# Patient Record
Sex: Female | Born: 1955 | Race: White | Hispanic: No | Marital: Married | Smoking: Current every day smoker
Health system: Southern US, Community
[De-identification: ages and names within clinical notes are randomized; demographics above are authoritative.]

## PROBLEM LIST (undated history)

## (undated) DIAGNOSIS — Z8719 Personal history of other diseases of the digestive system: Secondary | ICD-10-CM

## (undated) DIAGNOSIS — T4145XA Adverse effect of unspecified anesthetic, initial encounter: Secondary | ICD-10-CM

## (undated) DIAGNOSIS — F329 Major depressive disorder, single episode, unspecified: Secondary | ICD-10-CM

## (undated) DIAGNOSIS — K5790 Diverticulosis of intestine, part unspecified, without perforation or abscess without bleeding: Secondary | ICD-10-CM

## (undated) DIAGNOSIS — F319 Bipolar disorder, unspecified: Secondary | ICD-10-CM

## (undated) DIAGNOSIS — K2970 Gastritis, unspecified, without bleeding: Secondary | ICD-10-CM

## (undated) DIAGNOSIS — T8859XA Other complications of anesthesia, initial encounter: Secondary | ICD-10-CM

## (undated) DIAGNOSIS — F32A Depression, unspecified: Secondary | ICD-10-CM

## (undated) DIAGNOSIS — M8700 Idiopathic aseptic necrosis of unspecified bone: Secondary | ICD-10-CM

## (undated) DIAGNOSIS — R112 Nausea with vomiting, unspecified: Secondary | ICD-10-CM

## (undated) DIAGNOSIS — K219 Gastro-esophageal reflux disease without esophagitis: Secondary | ICD-10-CM

## (undated) DIAGNOSIS — Z9889 Other specified postprocedural states: Secondary | ICD-10-CM

## (undated) DIAGNOSIS — F419 Anxiety disorder, unspecified: Secondary | ICD-10-CM

## (undated) HISTORY — PX: ROTATOR CUFF REPAIR: SHX139

## (undated) HISTORY — PX: ORIF WRIST FRACTURE: SHX2133

## (undated) HISTORY — PX: HERNIA REPAIR: SHX51

## (undated) HISTORY — PX: FRACTURE SURGERY: SHX138

## (undated) HISTORY — PX: TUBAL LIGATION: SHX77

---

## 2014-02-18 ENCOUNTER — Encounter (HOSPITAL_COMMUNITY): Payer: Self-pay | Admitting: Pharmacy Technician

## 2014-02-19 ENCOUNTER — Other Ambulatory Visit: Payer: Self-pay | Admitting: Orthopedic Surgery

## 2014-02-25 ENCOUNTER — Encounter (HOSPITAL_COMMUNITY)
Admission: RE | Admit: 2014-02-25 | Discharge: 2014-02-25 | Disposition: A | Discharge status: Home / Self Care | Payer: BC Managed Care – PPO | Source: Ambulatory Visit | Attending: Orthopedic Surgery | Admitting: Orthopedic Surgery

## 2014-02-25 ENCOUNTER — Encounter (HOSPITAL_COMMUNITY): Payer: Self-pay

## 2014-02-25 DIAGNOSIS — K5790 Diverticulosis of intestine, part unspecified, without perforation or abscess without bleeding: Secondary | ICD-10-CM | POA: Diagnosis not present

## 2014-02-25 DIAGNOSIS — Z8781 Personal history of (healed) traumatic fracture: Secondary | ICD-10-CM | POA: Insufficient documentation

## 2014-02-25 DIAGNOSIS — K219 Gastro-esophageal reflux disease without esophagitis: Secondary | ICD-10-CM | POA: Diagnosis not present

## 2014-02-25 DIAGNOSIS — K449 Diaphragmatic hernia without obstruction or gangrene: Secondary | ICD-10-CM | POA: Insufficient documentation

## 2014-02-25 DIAGNOSIS — F319 Bipolar disorder, unspecified: Secondary | ICD-10-CM | POA: Diagnosis not present

## 2014-02-25 DIAGNOSIS — Z720 Tobacco use: Secondary | ICD-10-CM | POA: Diagnosis not present

## 2014-02-25 DIAGNOSIS — J449 Chronic obstructive pulmonary disease, unspecified: Secondary | ICD-10-CM | POA: Diagnosis not present

## 2014-02-25 DIAGNOSIS — F329 Major depressive disorder, single episode, unspecified: Secondary | ICD-10-CM | POA: Diagnosis not present

## 2014-02-25 DIAGNOSIS — Z01818 Encounter for other preprocedural examination: Secondary | ICD-10-CM | POA: Insufficient documentation

## 2014-02-25 DIAGNOSIS — I700 Atherosclerosis of aorta: Secondary | ICD-10-CM | POA: Diagnosis not present

## 2014-02-25 DIAGNOSIS — F419 Anxiety disorder, unspecified: Secondary | ICD-10-CM | POA: Diagnosis not present

## 2014-02-25 HISTORY — DX: Adverse effect of unspecified anesthetic, initial encounter: T41.45XA

## 2014-02-25 HISTORY — DX: Bipolar disorder, unspecified: F31.9

## 2014-02-25 HISTORY — DX: Anxiety disorder, unspecified: F41.9

## 2014-02-25 HISTORY — DX: Gastro-esophageal reflux disease without esophagitis: K21.9

## 2014-02-25 HISTORY — DX: Major depressive disorder, single episode, unspecified: F32.9

## 2014-02-25 HISTORY — DX: Other specified postprocedural states: Z98.890

## 2014-02-25 HISTORY — DX: Gastritis, unspecified, without bleeding: K29.70

## 2014-02-25 HISTORY — DX: Diverticulosis of intestine, part unspecified, without perforation or abscess without bleeding: K57.90

## 2014-02-25 HISTORY — DX: Personal history of other diseases of the digestive system: Z87.19

## 2014-02-25 HISTORY — DX: Other complications of anesthesia, initial encounter: T88.59XA

## 2014-02-25 HISTORY — DX: Depression, unspecified: F32.A

## 2014-02-25 HISTORY — DX: Nausea with vomiting, unspecified: R11.2

## 2014-02-25 LAB — CBC WITH DIFFERENTIAL/PLATELET
Basophils Absolute: 0 10*3/uL (ref 0.0–0.1)
Basophils Relative: 1 % (ref 0–1)
EOS PCT: 1 % (ref 0–5)
Eosinophils Absolute: 0.1 10*3/uL (ref 0.0–0.7)
HEMATOCRIT: 38.1 % (ref 36.0–46.0)
Hemoglobin: 13.3 g/dL (ref 12.0–15.0)
LYMPHS ABS: 2.3 10*3/uL (ref 0.7–4.0)
LYMPHS PCT: 40 % (ref 12–46)
MCH: 30.9 pg (ref 26.0–34.0)
MCHC: 34.9 g/dL (ref 30.0–36.0)
MCV: 88.4 fL (ref 78.0–100.0)
MONO ABS: 0.4 10*3/uL (ref 0.1–1.0)
Monocytes Relative: 7 % (ref 3–12)
Neutro Abs: 3 10*3/uL (ref 1.7–7.7)
Neutrophils Relative %: 51 % (ref 43–77)
Platelets: 214 10*3/uL (ref 150–400)
RBC: 4.31 MIL/uL (ref 3.87–5.11)
RDW: 12.5 % (ref 11.5–15.5)
WBC: 5.8 10*3/uL (ref 4.0–10.5)

## 2014-02-25 LAB — BASIC METABOLIC PANEL
Anion gap: 15 (ref 5–15)
BUN: 4 mg/dL — ABNORMAL LOW (ref 6–23)
CALCIUM: 9.3 mg/dL (ref 8.4–10.5)
CO2: 21 meq/L (ref 19–32)
Chloride: 107 mEq/L (ref 96–112)
Creatinine, Ser: 0.73 mg/dL (ref 0.50–1.10)
GFR calc Af Amer: >90 mL/min (ref >90)
GFR calc non Af Amer: >90 mL/min (ref >90)
GLUCOSE: 82 mg/dL (ref 70–99)
Potassium: 3.3 mEq/L — ABNORMAL LOW (ref 3.7–5.3)
SODIUM: 143 meq/L (ref 137–147)

## 2014-02-25 LAB — SURGICAL PCR SCREEN
MRSA, PCR: NEGATIVE
STAPHYLOCOCCUS AUREUS: NEGATIVE

## 2014-02-25 LAB — PROTIME-INR
INR: 1.07 (ref 0.00–1.49)
Prothrombin Time: 14.1 seconds (ref 11.6–15.2)

## 2014-02-25 LAB — APTT: aPTT: 49 seconds — ABNORMAL HIGH (ref 24–37)

## 2014-02-25 LAB — TYPE AND SCREEN
ABO/RH(D): B POS
ANTIBODY SCREEN: NEGATIVE

## 2014-02-25 LAB — ABO/RH: ABO/RH(D): B POS

## 2014-02-25 NOTE — Progress Notes (Signed)
Denies any heart issues, has never had to see cardio.

## 2014-02-25 NOTE — Pre-Procedure Instructions (Signed)
Kathrin PennerSheila Galen  02/25/2014   Your procedure is scheduled on:  Monday, Nov. 9th   [facility]:00 AM.  Call this number if you have problems the morning of surgery: [phone]   Remember:   Do not eat food or drink liquids after midnight Sunday.   Take these medicines the morning of surgery with A SIP OF WATER: Klonopin, Lexapro, Omeprazole.  Please use inhaler the morning of surgery.   Do not wear jewelry, make-up or nail polish.  Do not wear lotions, powders, or perfumes. You may NOT wear deodorant the day of surgery.  Do not shave underarms & legs 48 hours prior to surgery.    Do not bring valuables to the hospital.  [facility] not [facility] for any belongings or valuables.               Contacts, dentures or bridgework may not be worn into surgery.  Leave suitcase in the car. After surgery it may be brought to your room.  For patients admitted to the hospital, discharge time is determined by your treatment team.    Name and phone number of your driver:    Special Instructions: "Preparing for Surgery" instruction sheet.   Please read over the following fact sheets that you were given: Pain Booklet, Coughing and Deep Breathing, Blood Transfusion Information, MRSA Information and Surgical Site Infection Prevention

## 2014-02-26 LAB — URINE CULTURE
COLONY COUNT: NO GROWTH
Culture: NO GROWTH

## 2014-02-26 NOTE — Progress Notes (Addendum)
Anesthesia Chart Review:  Patient is a 58 year old female scheduled for left TKA on 03/04/14 with Dr. Turner Danielsowan.  History includes smoking, GERD, hiatal hernia, diverticulosis, depression, anxiety, Bipolar disorder, post-operative N/V, right rotator cuff repair, ORIF left wrist fracture, hernia repair. PCP is listed as Dr. Abelina BachelorLouis Alan Dean.  Meds: includes cranberry, Mucinex, Metamucil, albuterol, ASA 81 mg, Lipitor, Klonopin, Lexapro, Prilosec, Miralax, Topamax.  02/25/14 EKG: NSR.  02/25/14 CXR: COPD/chronic bronchitis, without acute superimposed process. Aortic atherosclerosis. Vessel on end versus calcified granuloma within the left upper lobe.  Preoperative labs noted.  Urine culture is still pending. PTT 49.  PT/INR WNL.  No blood thinners listed other than baby ASA; no history of blood dyscrasia reported. CBC WNL. I called and left a voice message with Dr. Wadie Lessenowan's scheduled  regarding confirming that Dr. Turner Danielsowan had reviewed labs including elevated PTT.  I will place an order to repeat her PTT on arrival to re-evaluate.  If her result is felt acceptable for this procedure then I would anticipate that she could proceed as planned.  Velna Ochsllison Kyli Sorter, PA-C [facility]/Anesthesiology Phone [phone] 02/26/2014 3:28 PM  Addendum:  I received a voice message from Dannielle BurnEric Phillips, PA-C with Dr. Turner Danielsowan stating he is okay with rechecking PTT on the day of surgery.  He thought LFTs may need to be checked at some point, so I'll add HFP to labs as well.  Urine culture showed no growth.  Velna Ochsllison Arcenia Scarbro, PA-C [facility]/Anesthesiology Phone [phone] 02/27/2014 9:19 AM

## 2014-02-28 NOTE — H&P (Signed)
TOTAL KNEE ADMISSION H&P  Patient is being admitted for left total knee arthroplasty.  Subjective:  Chief Complaint:left knee pain.  HPI: Samantha PennerSheila Rabadan, 58 y.o. female, has a history of pain and functional disability in the left knee due to arthritis and has failed non-surgical conservative treatments for greater than 12 weeks to includeNSAID's and/or analgesics, corticosteriod injections, use of assistive devices and activity modification.  Onset of symptoms was abrupt, starting 2 years ago with gradually worsening course since that time. The patient noted prior procedures on the knee to include  arthroscopy on the left knee(s).  Patient currently rates pain in the left knee(s) at 10 out of 10 with activity. Patient has night pain, worsening of pain with activity and weight bearing, pain that interferes with activities of daily living, pain with passive range of motion, crepitus and joint swelling.  Patient has evidence of subchondral cysts, joint space narrowing and AVN by imaging studies. There is no active infection.  There are no active problems to display for this patient.  Past Medical History  Diagnosis Date  . Complication of anesthesia   . PONV (postoperative nausea and vomiting)   . GERD (gastroesophageal reflux disease)   . History of hiatal hernia   . Diverticulosis   . Gastritis   . Depression   . Bipolar disorder   . Anxiety     Past Surgical History  Procedure Laterality Date  . Hernia repair    . Rotator cuff repair      right  . Orif wrist fracture      has 15 screws and t-rod  . Fracture surgery      left  . Tubal ligation      No prescriptions prior to admission   Allergies  Allergen Reactions  . Penicillins Anaphylaxis    History  Substance Use Topics  . Smoking status: Current Every Day Smoker -- 1.00 packs/day for 37 years    Types: Cigarettes  . Smokeless tobacco: Not on file  . Alcohol Use: No    No family history on file.   Review of Systems   Constitutional: Negative.   HENT: Negative.   Eyes: Negative.   Respiratory: Negative.   Cardiovascular: Negative.   Genitourinary: Negative.   Musculoskeletal: Positive for joint pain.  Skin: Negative.   Neurological: Negative.   Endo/Heme/Allergies: Negative.   Psychiatric/Behavioral: Positive for depression. The patient is nervous/anxious.     Objective:  Physical Exam  Constitutional: She is oriented to person, place, and time. She appears well-developed and well-nourished.  HENT:  Head: Normocephalic and atraumatic.  Eyes: Pupils are equal, round, and reactive to light.  Neck: Normal range of motion. Neck supple.  Cardiovascular: Intact distal pulses.   Respiratory: Effort normal.  Musculoskeletal: She exhibits tenderness.  The left knee is one plus effusion tender along the medial joint line pain with varus stress.  No pain with valgus stress.  Collateral ligaments are stable.  Her skin is intact normal pulses to the foot, normal sensation of the foot.  Her toes are well-perfused.    Neurological: She is alert and oriented to person, place, and time.  Skin: Skin is warm and dry.  Psychiatric: She has a normal mood and affect. Her behavior is normal. Judgment and thought content normal.    Vital signs in last 24 hours:    Labs:   There is no height or weight on file to calculate BMI.   Imaging Review Plain radiographs demonstrate AP, Rosenberg, lateral  and sunrise x-rays of both knees are normal except for an ossicle coming off the lateral facet of the patella to the right knee that looks to be old chronic and may not be related to her discomfort.  Assessment/Plan:  Symptomatic Avascular Necrosis, left knee   The patient history, physical examination, clinical judgment of the provider and imaging studies are consistent with end stage degenerative joint disease and AVN of the left knee(s) and total knee arthroplasty is deemed medically necessary. The treatment  options including medical management, injection therapy arthroscopy and arthroplasty were discussed at length. The risks and benefits of total knee arthroplasty were presented and reviewed. The risks due to aseptic loosening, infection, stiffness, patella tracking problems, thromboembolic complications and other imponderables were discussed. The patient acknowledged the explanation, agreed to proceed with the plan and consent was signed. Patient is being admitted for inpatient treatment for surgery, pain control, PT, OT, prophylactic antibiotics, VTE prophylaxis, progressive ambulation and ADL's and discharge planning. The patient is planning to be discharged home with home health services

## 2014-03-03 MED ORDER — TRANEXAMIC ACID 100 MG/ML IV SOLN
1000.0000 mg | INTRAVENOUS | Status: AC
  Administered 2014-03-04: 1000 mg via INTRAVENOUS
  Filled 2014-03-03: qty 10

## 2014-03-03 MED ORDER — KCL IN DEXTROSE-NACL 20-5-0.2 MEQ/L-%-% IV SOLN
INTRAVENOUS | Status: DC
  Filled 2014-03-03 (×2): qty 1000

## 2014-03-03 MED ORDER — VANCOMYCIN HCL IN DEXTROSE 1-5 GM/200ML-% IV SOLN
1000.0000 mg | INTRAVENOUS | Status: AC
  Administered 2014-03-04: 1000 mg via INTRAVENOUS
  Filled 2014-03-03: qty 200

## 2014-03-04 ENCOUNTER — Inpatient Hospital Stay (HOSPITAL_COMMUNITY): Payer: BC Managed Care – PPO | Admitting: Vascular Surgery

## 2014-03-04 ENCOUNTER — Inpatient Hospital Stay (HOSPITAL_COMMUNITY): Payer: BC Managed Care – PPO | Admitting: Anesthesiology

## 2014-03-04 ENCOUNTER — Inpatient Hospital Stay (HOSPITAL_COMMUNITY)
Admission: RE | Admit: 2014-03-04 | Discharge: 2014-03-06 | DRG: 470 | Disposition: A | Discharge status: Home Health | Payer: BC Managed Care – PPO | Source: Ambulatory Visit | Attending: Orthopedic Surgery | Admitting: Orthopedic Surgery

## 2014-03-04 ENCOUNTER — Encounter (HOSPITAL_COMMUNITY): Payer: Self-pay | Admitting: Certified Registered Nurse Anesthetist

## 2014-03-04 ENCOUNTER — Encounter (HOSPITAL_COMMUNITY)
Admission: RE | Disposition: A | Discharge status: Home Health | Payer: Self-pay | Source: Ambulatory Visit | Attending: Orthopedic Surgery

## 2014-03-04 DIAGNOSIS — D62 Acute posthemorrhagic anemia: Secondary | ICD-10-CM | POA: Diagnosis not present

## 2014-03-04 DIAGNOSIS — M179 Osteoarthritis of knee, unspecified: Secondary | ICD-10-CM | POA: Diagnosis present

## 2014-03-04 DIAGNOSIS — K219 Gastro-esophageal reflux disease without esophagitis: Secondary | ICD-10-CM | POA: Diagnosis present

## 2014-03-04 DIAGNOSIS — F17210 Nicotine dependence, cigarettes, uncomplicated: Secondary | ICD-10-CM | POA: Diagnosis present

## 2014-03-04 DIAGNOSIS — F418 Other specified anxiety disorders: Secondary | ICD-10-CM | POA: Diagnosis present

## 2014-03-04 DIAGNOSIS — Z880 Allergy status to penicillin: Secondary | ICD-10-CM

## 2014-03-04 DIAGNOSIS — M879 Osteonecrosis, unspecified: Secondary | ICD-10-CM | POA: Diagnosis present

## 2014-03-04 DIAGNOSIS — M1712 Unilateral primary osteoarthritis, left knee: Secondary | ICD-10-CM | POA: Diagnosis present

## 2014-03-04 DIAGNOSIS — F319 Bipolar disorder, unspecified: Secondary | ICD-10-CM | POA: Diagnosis present

## 2014-03-04 DIAGNOSIS — M25562 Pain in left knee: Secondary | ICD-10-CM | POA: Diagnosis present

## 2014-03-04 HISTORY — DX: Idiopathic aseptic necrosis of unspecified bone: M87.00

## 2014-03-04 HISTORY — PX: TOTAL KNEE ARTHROPLASTY: SHX125

## 2014-03-04 LAB — HEPATIC FUNCTION PANEL
ALT: 12 U/L (ref 0–35)
AST: 19 U/L (ref 0–37)
Albumin: 4.1 g/dL (ref 3.5–5.2)
Alkaline Phosphatase: 70 U/L (ref 39–117)
Bilirubin, Direct: <0.2 mg/dL (ref 0.0–0.3)
TOTAL PROTEIN: 6.8 g/dL (ref 6.0–8.3)
Total Bilirubin: 0.3 mg/dL (ref 0.3–1.2)

## 2014-03-04 LAB — APTT: aPTT: 44 seconds — ABNORMAL HIGH (ref 24–37)

## 2014-03-04 SURGERY — ARTHROPLASTY, KNEE, TOTAL
Anesthesia: General | Site: Knee | Laterality: Left

## 2014-03-04 MED ORDER — OXYCODONE-ACETAMINOPHEN 5-325 MG PO TABS
1.0000 | ORAL_TABLET | ORAL | Status: DC | PRN
Start: 2014-03-04 — End: 2014-03-13

## 2014-03-04 MED ORDER — ONDANSETRON HCL 4 MG/2ML IJ SOLN
INTRAMUSCULAR | Status: AC
  Filled 2014-03-04: qty 2

## 2014-03-04 MED ORDER — CEFUROXIME SODIUM 1.5 G IJ SOLR
INTRAMUSCULAR | Status: AC
  Filled 2014-03-04: qty 1.5

## 2014-03-04 MED ORDER — STERILE WATER FOR INJECTION IJ SOLN
INTRAMUSCULAR | Status: AC
  Filled 2014-03-04: qty 10

## 2014-03-04 MED ORDER — ONDANSETRON HCL 4 MG/2ML IJ SOLN
4.0000 mg | Freq: Four times a day (QID) | INTRAMUSCULAR | Status: DC | PRN
  Administered 2014-03-06: 4 mg via INTRAVENOUS
  Filled 2014-03-04: qty 2

## 2014-03-04 MED ORDER — KCL IN DEXTROSE-NACL 20-5-0.45 MEQ/L-%-% IV SOLN
INTRAVENOUS | Status: DC
  Administered 2014-03-04 – 2014-03-05 (×2): via INTRAVENOUS
  Filled 2014-03-04 (×9): qty 1000

## 2014-03-04 MED ORDER — METHOCARBAMOL 1000 MG/10ML IJ SOLN
500.0000 mg | Freq: Once | INTRAVENOUS | Status: DC
  Administered 2014-03-04: 500 mg via INTRAVENOUS
  Filled 2014-03-04: qty 5

## 2014-03-04 MED ORDER — ACETAMINOPHEN 650 MG RE SUPP: 650.0000 mg | Freq: Four times a day (QID) | RECTAL | Status: DC | PRN

## 2014-03-04 MED ORDER — SODIUM CHLORIDE 0.9 % IV SOLN
INTRAVENOUS | Status: DC | PRN
  Administered 2014-03-04: 40 mL via INTRAMUSCULAR

## 2014-03-04 MED ORDER — TOPIRAMATE 100 MG PO TABS
100.0000 mg | ORAL_TABLET | Freq: Every day | ORAL | Status: DC
  Administered 2014-03-05 – 2014-03-06 (×2): 100 mg via ORAL
  Filled 2014-03-04 (×2): qty 1

## 2014-03-04 MED ORDER — DIPHENHYDRAMINE HCL 12.5 MG/5ML PO ELIX: 12.5000 mg | ORAL_SOLUTION | ORAL | Status: DC | PRN

## 2014-03-04 MED ORDER — MIDAZOLAM HCL 5 MG/ML IJ SOLN: 2.0000 mg | Freq: Once | INTRAMUSCULAR | Status: DC

## 2014-03-04 MED ORDER — BUPIVACAINE LIPOSOME 1.3 % IJ SUSP
20.0000 mL | INTRAMUSCULAR | Status: AC
  Administered 2014-03-04: 20 mL
  Filled 2014-03-04: qty 20

## 2014-03-04 MED ORDER — POLYETHYLENE GLYCOL 3350 17 G PO PACK
17.0000 g | PACK | Freq: Every day | ORAL | Status: DC
  Administered 2014-03-04 – 2014-03-06 (×3): 17 g via ORAL
  Filled 2014-03-04 (×4): qty 1

## 2014-03-04 MED ORDER — MIDAZOLAM HCL 2 MG/2ML IJ SOLN
INTRAMUSCULAR | Status: AC
  Administered 2014-03-04: 2 mg
  Filled 2014-03-04: qty 2

## 2014-03-04 MED ORDER — DOCUSATE SODIUM 100 MG PO CAPS
100.0000 mg | ORAL_CAPSULE | Freq: Two times a day (BID) | ORAL | Status: DC
  Administered 2014-03-04 – 2014-03-06 (×4): 100 mg via ORAL
  Filled 2014-03-04 (×5): qty 1

## 2014-03-04 MED ORDER — ATORVASTATIN CALCIUM 20 MG PO TABS
20.0000 mg | ORAL_TABLET | Freq: Every day | ORAL | Status: DC
  Administered 2014-03-04 – 2014-03-06 (×3): 20 mg via ORAL
  Filled 2014-03-04 (×4): qty 1

## 2014-03-04 MED ORDER — HYDROMORPHONE HCL 1 MG/ML IJ SOLN
0.2500 mg | INTRAMUSCULAR | Status: DC | PRN
  Administered 2014-03-04 (×3): 0.5 mg via INTRAVENOUS

## 2014-03-04 MED ORDER — ESCITALOPRAM OXALATE 20 MG PO TABS
20.0000 mg | ORAL_TABLET | Freq: Every day | ORAL | Status: DC
  Administered 2014-03-05 – 2014-03-06 (×2): 20 mg via ORAL
  Filled 2014-03-04 (×2): qty 1

## 2014-03-04 MED ORDER — INFLUENZA VAC SPLIT QUAD 0.5 ML IM SUSY
0.5000 mL | PREFILLED_SYRINGE | INTRAMUSCULAR | Status: AC
  Administered 2014-03-06: 0.5 mL via INTRAMUSCULAR
  Filled 2014-03-04: qty 0.5

## 2014-03-04 MED ORDER — TIZANIDINE HCL 2 MG PO CAPS: 2.0000 mg | ORAL_CAPSULE | Freq: Three times a day (TID) | ORAL | Status: DC

## 2014-03-04 MED ORDER — MIDAZOLAM HCL 2 MG/2ML IJ SOLN
INTRAMUSCULAR | Status: AC
  Filled 2014-03-04: qty 2

## 2014-03-04 MED ORDER — METOCLOPRAMIDE HCL 10 MG PO TABS: 5.0000 mg | ORAL_TABLET | Freq: Three times a day (TID) | ORAL | Status: DC | PRN

## 2014-03-04 MED ORDER — PNEUMOCOCCAL VAC POLYVALENT 25 MCG/0.5ML IJ INJ
0.5000 mL | INJECTION | INTRAMUSCULAR | Status: AC
  Administered 2014-03-06: 0.5 mL via INTRAMUSCULAR
  Filled 2014-03-04: qty 0.5

## 2014-03-04 MED ORDER — MENTHOL 3 MG MT LOZG: 1.0000 | LOZENGE | OROMUCOSAL | Status: DC | PRN

## 2014-03-04 MED ORDER — METOCLOPRAMIDE HCL 5 MG/ML IJ SOLN: 5.0000 mg | Freq: Three times a day (TID) | INTRAMUSCULAR | Status: DC | PRN

## 2014-03-04 MED ORDER — HYDROMORPHONE HCL 1 MG/ML IJ SOLN
INTRAMUSCULAR | Status: AC
  Filled 2014-03-04: qty 1

## 2014-03-04 MED ORDER — FENTANYL CITRATE 0.05 MG/ML IJ SOLN
INTRAMUSCULAR | Status: DC | PRN
  Administered 2014-03-04 (×6): 25 ug via INTRAVENOUS

## 2014-03-04 MED ORDER — ONDANSETRON HCL 4 MG/2ML IJ SOLN
INTRAMUSCULAR | Status: DC | PRN
  Administered 2014-03-04 (×2): 4 mg via INTRAVENOUS

## 2014-03-04 MED ORDER — TOPIRAMATE 100 MG PO TABS
200.0000 mg | ORAL_TABLET | Freq: Every day | ORAL | Status: DC
  Administered 2014-03-04 – 2014-03-05 (×2): 200 mg via ORAL
  Filled 2014-03-04 (×3): qty 2

## 2014-03-04 MED ORDER — OXYCODONE HCL 5 MG PO TABS
5.0000 mg | ORAL_TABLET | ORAL | Status: DC | PRN
  Administered 2014-03-04 – 2014-03-06 (×9): 10 mg via ORAL
  Filled 2014-03-04 (×10): qty 2

## 2014-03-04 MED ORDER — MAGNESIUM CITRATE PO SOLN: 1.0000 | Freq: Once | ORAL | Status: AC | PRN

## 2014-03-04 MED ORDER — HYDROMORPHONE HCL 1 MG/ML IJ SOLN
1.0000 mg | INTRAMUSCULAR | Status: DC | PRN
  Administered 2014-03-05 – 2014-03-06 (×3): 1 mg via INTRAVENOUS
  Filled 2014-03-04 (×3): qty 1

## 2014-03-04 MED ORDER — METHOCARBAMOL 1000 MG/10ML IJ SOLN
500.0000 mg | Freq: Four times a day (QID) | INTRAVENOUS | Status: DC | PRN
  Filled 2014-03-04: qty 5

## 2014-03-04 MED ORDER — SENNOSIDES-DOCUSATE SODIUM 8.6-50 MG PO TABS: 1.0000 | ORAL_TABLET | Freq: Every evening | ORAL | Status: DC | PRN

## 2014-03-04 MED ORDER — EPHEDRINE SULFATE 50 MG/ML IJ SOLN
INTRAMUSCULAR | Status: AC
  Filled 2014-03-04: qty 1

## 2014-03-04 MED ORDER — FENTANYL CITRATE 0.05 MG/ML IJ SOLN
INTRAMUSCULAR | Status: AC
  Filled 2014-03-04: qty 2

## 2014-03-04 MED ORDER — CLONAZEPAM 1 MG PO TABS
1.0000 mg | ORAL_TABLET | Freq: Three times a day (TID) | ORAL | Status: DC
  Administered 2014-03-04 – 2014-03-06 (×6): 1 mg via ORAL
  Filled 2014-03-04 (×6): qty 1

## 2014-03-04 MED ORDER — PHENOL 1.4 % MT LIQD: 1.0000 | OROMUCOSAL | Status: DC | PRN

## 2014-03-04 MED ORDER — PROMETHAZINE HCL 25 MG/ML IJ SOLN: 6.2500 mg | INTRAMUSCULAR | Status: DC | PRN

## 2014-03-04 MED ORDER — DEXTROSE 5 % IV SOLN
INTRAVENOUS | Status: DC | PRN
  Administered 2014-03-04: 10:00:00 via INTRAVENOUS

## 2014-03-04 MED ORDER — SCOPOLAMINE 1 MG/3DAYS TD PT72
MEDICATED_PATCH | TRANSDERMAL | Status: DC | PRN
  Administered 2014-03-04: 1 via TRANSDERMAL

## 2014-03-04 MED ORDER — ARTIFICIAL TEARS OP OINT
TOPICAL_OINTMENT | OPHTHALMIC | Status: AC
  Filled 2014-03-04: qty 3.5

## 2014-03-04 MED ORDER — LIDOCAINE HCL (CARDIAC) 20 MG/ML IV SOLN
INTRAVENOUS | Status: DC | PRN
  Administered 2014-03-04: 60 mg via INTRAVENOUS

## 2014-03-04 MED ORDER — FENTANYL CITRATE 0.05 MG/ML IJ SOLN
INTRAMUSCULAR | Status: AC
  Filled 2014-03-04: qty 5

## 2014-03-04 MED ORDER — ACETAMINOPHEN 325 MG PO TABS
650.0000 mg | ORAL_TABLET | Freq: Four times a day (QID) | ORAL | Status: DC | PRN
  Administered 2014-03-05 – 2014-03-06 (×2): 650 mg via ORAL
  Filled 2014-03-04 (×2): qty 2

## 2014-03-04 MED ORDER — PROPOFOL 10 MG/ML IV BOLUS
INTRAVENOUS | Status: DC | PRN
  Administered 2014-03-04: 150 mg via INTRAVENOUS

## 2014-03-04 MED ORDER — DEXAMETHASONE SODIUM PHOSPHATE 4 MG/ML IJ SOLN
INTRAMUSCULAR | Status: DC | PRN
  Administered 2014-03-04: 4 mg via INTRAVENOUS

## 2014-03-04 MED ORDER — PROPOFOL 10 MG/ML IV BOLUS
INTRAVENOUS | Status: AC
  Filled 2014-03-04: qty 20

## 2014-03-04 MED ORDER — GLYCOPYRROLATE 0.2 MG/ML IJ SOLN
INTRAMUSCULAR | Status: AC
  Filled 2014-03-04: qty 2

## 2014-03-04 MED ORDER — ROPIVACAINE HCL 5 MG/ML IJ SOLN
INTRAMUSCULAR | Status: DC | PRN
  Administered 2014-03-04: 25 mL via PERINEURAL

## 2014-03-04 MED ORDER — LIDOCAINE HCL (CARDIAC) 20 MG/ML IV SOLN
INTRAVENOUS | Status: AC
  Filled 2014-03-04: qty 5

## 2014-03-04 MED ORDER — SODIUM CHLORIDE 0.9 % IR SOLN
Status: DC | PRN
  Administered 2014-03-04: 1000 mL

## 2014-03-04 MED ORDER — METHOCARBAMOL 500 MG PO TABS
500.0000 mg | ORAL_TABLET | Freq: Four times a day (QID) | ORAL | Status: DC | PRN
  Administered 2014-03-05 – 2014-03-06 (×3): 500 mg via ORAL
  Filled 2014-03-04 (×6): qty 1

## 2014-03-04 MED ORDER — ALUM & MAG HYDROXIDE-SIMETH 200-200-20 MG/5ML PO SUSP: 30.0000 mL | ORAL | Status: DC | PRN

## 2014-03-04 MED ORDER — ALBUTEROL SULFATE (2.5 MG/3ML) 0.083% IN NEBU: 3.0000 mL | INHALATION_SOLUTION | Freq: Four times a day (QID) | RESPIRATORY_TRACT | Status: DC | PRN

## 2014-03-04 MED ORDER — GLYCOPYRROLATE 0.2 MG/ML IJ SOLN
INTRAMUSCULAR | Status: DC | PRN
  Administered 2014-03-04: 0.2 mg via INTRAVENOUS

## 2014-03-04 MED ORDER — BISACODYL 5 MG PO TBEC: 5.0000 mg | DELAYED_RELEASE_TABLET | Freq: Every day | ORAL | Status: DC | PRN

## 2014-03-04 MED ORDER — PANTOPRAZOLE SODIUM 40 MG PO TBEC
40.0000 mg | DELAYED_RELEASE_TABLET | Freq: Every day | ORAL | Status: DC
  Administered 2014-03-05 – 2014-03-06 (×2): 40 mg via ORAL
  Filled 2014-03-04: qty 1

## 2014-03-04 MED ORDER — EPHEDRINE SULFATE 50 MG/ML IJ SOLN
INTRAMUSCULAR | Status: DC | PRN
  Administered 2014-03-04: 10 mg via INTRAVENOUS

## 2014-03-04 MED ORDER — PSYLLIUM 95 % PO PACK
1.0000 | PACK | Freq: Every day | ORAL | Status: DC
  Administered 2014-03-04 – 2014-03-06 (×3): 1 via ORAL
  Filled 2014-03-04 (×3): qty 1

## 2014-03-04 MED ORDER — ONDANSETRON HCL 4 MG PO TABS
4.0000 mg | ORAL_TABLET | Freq: Four times a day (QID) | ORAL | Status: DC | PRN
  Administered 2014-03-06: 4 mg via ORAL
  Filled 2014-03-04: qty 1

## 2014-03-04 MED ORDER — SCOPOLAMINE 1 MG/3DAYS TD PT72
MEDICATED_PATCH | TRANSDERMAL | Status: AC
  Filled 2014-03-04: qty 1

## 2014-03-04 MED ORDER — ASPIRIN EC 325 MG PO TBEC
325.0000 mg | DELAYED_RELEASE_TABLET | Freq: Every day | ORAL | Status: DC
  Administered 2014-03-05: 325 mg via ORAL
  Filled 2014-03-04 (×3): qty 1

## 2014-03-04 MED ORDER — MIDAZOLAM HCL 5 MG/5ML IJ SOLN
INTRAMUSCULAR | Status: DC | PRN
  Administered 2014-03-04: 2 mg via INTRAVENOUS

## 2014-03-04 MED ORDER — ASPIRIN EC 325 MG PO TBEC: 325.0000 mg | DELAYED_RELEASE_TABLET | Freq: Two times a day (BID) | ORAL | Status: DC

## 2014-03-04 MED ORDER — LACTATED RINGERS IV SOLN
INTRAVENOUS | Status: DC
  Administered 2014-03-04 (×2): via INTRAVENOUS

## 2014-03-04 SURGICAL SUPPLY — 60 items
BANDAGE ELASTIC 6 VELCRO ST LF (GAUZE/BANDAGES/DRESSINGS) ×3 IMPLANT
BANDAGE ESMARK 6X9 LF (GAUZE/BANDAGES/DRESSINGS) ×1 IMPLANT
BLADE SAG 18X100X1.27 (BLADE) ×3 IMPLANT
BLADE SAW SGTL 13X75X1.27 (BLADE) ×3 IMPLANT
BLADE SURG ROTATE 9660 (MISCELLANEOUS) IMPLANT
BNDG ELASTIC 6X10 VLCR STRL LF (GAUZE/BANDAGES/DRESSINGS) IMPLANT
BNDG ESMARK 6X9 LF (GAUZE/BANDAGES/DRESSINGS) ×3
BOWL SMART MIX CTS (DISPOSABLE) ×3 IMPLANT
CAPT RP KNEE ×3 IMPLANT
CEMENT HV SMART SET (Cement) ×6 IMPLANT
COVER SURGICAL LIGHT HANDLE (MISCELLANEOUS) ×3 IMPLANT
CUFF TOURNIQUET SINGLE 24IN (TOURNIQUET CUFF) ×3 IMPLANT
CUFF TOURNIQUET SINGLE 34IN LL (TOURNIQUET CUFF) IMPLANT
CUFF TOURNIQUET SINGLE 44IN (TOURNIQUET CUFF) IMPLANT
DRAPE EXTREMITY T 121X128X90 (DRAPE) ×3 IMPLANT
DRAPE IMP U-DRAPE 54X76 (DRAPES) ×3 IMPLANT
DRAPE U-SHAPE 47X51 STRL (DRAPES) ×3 IMPLANT
DURAPREP 26ML APPLICATOR (WOUND CARE) ×3 IMPLANT
ELECT REM PT RETURN 9FT ADLT (ELECTROSURGICAL) ×3
ELECTRODE REM PT RTRN 9FT ADLT (ELECTROSURGICAL) ×1 IMPLANT
EVACUATOR 1/8 PVC DRAIN (DRAIN) ×3 IMPLANT
GAUZE SPONGE 4X4 12PLY STRL (GAUZE/BANDAGES/DRESSINGS) ×3 IMPLANT
GAUZE XEROFORM 1X8 LF (GAUZE/BANDAGES/DRESSINGS) ×3 IMPLANT
GLOVE BIO SURGEON STRL SZ7.5 (GLOVE) ×3 IMPLANT
GLOVE BIO SURGEON STRL SZ8.5 (GLOVE) ×3 IMPLANT
GLOVE BIOGEL PI IND STRL 8 (GLOVE) ×1 IMPLANT
GLOVE BIOGEL PI IND STRL 9 (GLOVE) ×1 IMPLANT
GLOVE BIOGEL PI INDICATOR 8 (GLOVE) ×2
GLOVE BIOGEL PI INDICATOR 9 (GLOVE) ×2
GOWN STRL REUS W/ TWL LRG LVL3 (GOWN DISPOSABLE) ×1 IMPLANT
GOWN STRL REUS W/ TWL XL LVL3 (GOWN DISPOSABLE) ×2 IMPLANT
GOWN STRL REUS W/TWL LRG LVL3 (GOWN DISPOSABLE) ×2
GOWN STRL REUS W/TWL XL LVL3 (GOWN DISPOSABLE) ×4
HANDPIECE INTERPULSE COAX TIP (DISPOSABLE) ×2
HOOD PEEL AWAY FACE SHEILD DIS (HOOD) ×6 IMPLANT
KIT BASIN OR (CUSTOM PROCEDURE TRAY) ×3 IMPLANT
KIT ROOM TURNOVER OR (KITS) ×3 IMPLANT
MANIFOLD NEPTUNE II (INSTRUMENTS) ×3 IMPLANT
NDL SAFETY ECLIPSE 18X1.5 (NEEDLE) IMPLANT
NEEDLE 22X1 1/2 (OR ONLY) (NEEDLE) IMPLANT
NEEDLE HYPO 18GX1.5 SHARP (NEEDLE)
NEEDLE SPNL 18GX3.5 QUINCKE PK (NEEDLE) ×3 IMPLANT
NS IRRIG 1000ML POUR BTL (IV SOLUTION) ×3 IMPLANT
PACK TOTAL JOINT (CUSTOM PROCEDURE TRAY) ×3 IMPLANT
PACK UNIVERSAL I (CUSTOM PROCEDURE TRAY) ×3 IMPLANT
PAD ARMBOARD 7.5X6 YLW CONV (MISCELLANEOUS) ×3 IMPLANT
PADDING CAST COTTON 6X4 STRL (CAST SUPPLIES) ×3 IMPLANT
SET HNDPC FAN SPRY TIP SCT (DISPOSABLE) ×1 IMPLANT
STAPLER VISISTAT 35W (STAPLE) ×3 IMPLANT
SUCTION FRAZIER TIP 10 FR DISP (SUCTIONS) IMPLANT
SUT VIC AB 0 CTX 36 (SUTURE) ×2
SUT VIC AB 0 CTX36XBRD ANTBCTR (SUTURE) ×1 IMPLANT
SUT VIC AB 1 CTX 36 (SUTURE) ×2
SUT VIC AB 1 CTX36XBRD ANBCTR (SUTURE) ×1 IMPLANT
SUT VIC AB 2-0 CT1 27 (SUTURE) ×2
SUT VIC AB 2-0 CT1 TAPERPNT 27 (SUTURE) ×1 IMPLANT
SYR 30ML LL (SYRINGE) ×3 IMPLANT
SYR 50ML LL SCALE MARK (SYRINGE) ×3 IMPLANT
TOWEL OR 17X24 6PK STRL BLUE (TOWEL DISPOSABLE) ×3 IMPLANT
TOWEL OR 17X26 10 PK STRL BLUE (TOWEL DISPOSABLE) ×3 IMPLANT

## 2014-03-04 NOTE — Anesthesia Procedure Notes (Addendum)
Anesthesia Regional Block:  Femoral nerve block  Pre-Anesthetic Checklist: ,, timeout performed, Correct Patient, Correct Site, Correct Laterality, Correct Procedure, Correct Position, site marked, Risks and benefits discussed,  Surgical consent,  Pre-op evaluation,  At surgeon's request and post-op pain management  Laterality: Left  Prep: chloraprep       Needles:  Injection technique: Single-shot  Needle Type: Echogenic Stimulator Needle     Needle Length: 9cm 9 cm Needle Gauge: 21 and 21 G    Additional Needles:  Procedures: ultrasound guided (picture in chart) Femoral nerve block Narrative:  Start time: 03/04/2014 9:21 AM End time: 03/04/2014 9:31 AM Injection made incrementally with aspirations every 5 mL.  Performed by: Personally   Additional Notes: Patient tolerated the procedure well without complications   Procedure Name: LMA Insertion Date/Time: 03/04/2014 9:50 AM Performed by: Margaree MackintoshYACOUB, Zachari Alberta B Pre-anesthesia Checklist: Patient identified, Emergency Drugs available, Suction available, Patient being monitored and Timeout performed Patient Re-evaluated:Patient Re-evaluated prior to inductionOxygen Delivery Method: Circle system utilized Preoxygenation: Pre-oxygenation with 100% oxygen Intubation Type: IV induction LMA: LMA inserted LMA Size: 4.0 Number of attempts: 1 Placement Confirmation: positive ETCO2 and breath sounds checked- equal and bilateral Tube secured with: Tape Dental Injury: Teeth and Oropharynx as per pre-operative assessment

## 2014-03-04 NOTE — Progress Notes (Signed)
Utilization review completed.  

## 2014-03-04 NOTE — Evaluation (Signed)
Physical Therapy Evaluation Patient Details Name: Samantha PennerSheila Jackowski MRN: [id number] DOB: 12-11-1955 Today's Date: 03/04/2014   History of Present Illness  Patient is a 58 yo female admitted 03/04/14 now s/p Lt TKA.  PMH:  Rt rotator cuff repair, wrist fx surgery, bipolar disorder, depression, anxiety  Clinical Impression  Patient presents with problems listed below.  Will benefit from acute PT to maximize independence prior to discharge home with husband.  Recommend HHPT for continued therapy at discharge.    Follow Up Recommendations Home health PT;Supervision/Assistance - 24 hour    Equipment Recommendations  3in1 (PT)    Recommendations for Other Services       Precautions / Restrictions Precautions Precautions: Knee;Fall Precaution Booklet Issued: Yes (comment) Precaution Comments: Reviewed precautions with patient and husband..  Patient did have falls at home (knees gaive out) Restrictions Weight Bearing Restrictions: Yes LLE Weight Bearing: Weight bearing as tolerated      Mobility  Bed Mobility               General bed mobility comments: Patient in chair as PT enterred room  Transfers Overall transfer level: Needs assistance Equipment used: Rolling walker (2 wheeled) Transfers: Sit to/from Stand Sit to Stand: Min assist         General transfer comment: Verbal cues for hand placement.  Assist to rise to standing and for balance.  Unsteady in stance.  Ambulation/Gait Ambulation/Gait assistance: Min assist Ambulation Distance (Feet): 5 Feet Assistive device: Rolling walker (2 wheeled) Gait Pattern/deviations: Step-to pattern;Decreased stance time - left;Decreased step length - right;Decreased weight shift to left;Antalgic Gait velocity: Decreased Gait velocity interpretation: Below normal speed for age/gender General Gait Details: Verbal cues for safe use of RW and gait sequence.  Patient hesitant to put Lt foot on floor.  Encouraged patient to put Lt foot  flat on floor.  Patient able to ambulate 5' - ended gait due to pain and fatigue.  Stairs            Wheelchair Mobility    Modified Rankin (Stroke Patients Only)       Balance                                             Pertinent Vitals/Pain Pain Assessment: 0-10 Pain Score: 7  Pain Location: Lt knee Pain Descriptors / Indicators: Throbbing Pain Intervention(s): Patient requesting pain meds-RN notified;RN gave pain meds during session;Repositioned    Home Living Family/patient expects to be discharged to:: Private residence Living Arrangements: Spouse/significant other Available Help at Discharge: Family;Available 24 hours/day (Husband available 24 hours/day x 7 days) Type of Home: House Home Access: Level entry     Home Layout: Two level;Able to live on main level with bedroom/bathroom Home Equipment: Dan HumphreysWalker - 2 wheels;Wheelchair - manual;Crutches;Cane - single point      Prior Function Level of Independence: Independent               Hand Dominance        Extremity/Trunk Assessment   Upper Extremity Assessment: Overall WFL for tasks assessed           Lower Extremity Assessment: LLE deficits/detail   LLE Deficits / Details: Decreased strength and ROM post-op  Cervical / Trunk Assessment: Normal  Communication   Communication: No difficulties  Cognition Arousal/Alertness: Lethargic;Suspect due to medications Behavior During Therapy: Anxious Overall Cognitive Status: Within  Functional Limits for tasks assessed                      General Comments      Exercises Total Joint Exercises Ankle Circles/Pumps: AROM;Both;10 reps;Seated Quad Sets: AROM;Left;5 reps;Seated      Assessment/Plan    PT Assessment Patient needs continued PT services  PT Diagnosis Difficulty walking;Acute pain   PT Problem List Decreased strength;Decreased range of motion;Decreased activity tolerance;Decreased balance;Decreased  mobility;Decreased knowledge of use of DME;Decreased knowledge of precautions;Pain  PT Treatment Interventions DME instruction;Gait training;Functional mobility training;Therapeutic activities;Therapeutic exercise;Patient/family education   PT Goals (Current goals can be found in the Care Plan section) Acute Rehab PT Goals Patient Stated Goal: To decrease pain PT Goal Formulation: With patient/family Time For Goal Achievement: 03/11/14 Potential to Achieve Goals: Good    Frequency 7X/week   Barriers to discharge        Co-evaluation               End of Session Equipment Utilized During Treatment: Gait belt Activity Tolerance: Patient limited by fatigue;Patient limited by pain Patient left: in chair;with call bell/phone within reach;with family/visitor present Nurse Communication: Mobility status;Patient requests pain meds         Time: 1726-1750 PT Time Calculation (min): 24 min   Charges:   PT Evaluation $Initial PT Evaluation Tier I: 1 Procedure PT Treatments $Gait Training: 8-22 mins   PT G Codes:          Vena AustriaDavis, Jaslin Novitski H 03/04/2014, 7:01 PM Durenda HurtSusan H. Renaldo Fiddleravis, PT, [facility] Acute Rehab Services Pager [phone]

## 2014-03-04 NOTE — Op Note (Signed)
PATIENT ID:      Samantha Michael  MRN:     [id number] DOB/AGE:    [birth year] / 58 y.o.       OPERATIVE REPORT    DATE OF PROCEDURE:  03/04/2014       PREOPERATIVE DIAGNOSIS:   LEFT KNEE AVASCULAR NECROSIS       Estimated body mass index is 20.85 kg/(m^2) as calculated from the following:   Height as of 02/25/14: 5\' 2"  (1.575 m).   Weight as of this encounter: 51.71 kg (114 lb).                                                        POSTOPERATIVE DIAGNOSIS:   LEFT KNEE AVASCULAR NECROSIS                                                                       PROCEDURE:  Procedure(s): LEFT TOTAL KNEE ARTHROPLASTY Using Depuy Sigma RP implants #2.5L Femur, #2.5Tibia, 10mm Sigma RP bearing, 35 Patella     SURGEON: Nickole Adamek J    ASSISTANT:   Eric K. [employer] PA-C   (Present and scrubbed throughout the case, critical for assistance with exposure, retraction, instrumentation, and closure.)         ANESTHESIA: GET, ACB, Exparel  DRAINS: 2 medium hemovac in knee   TOURNIQUET TIME: 75min   COMPLICATIONS:  None     SPECIMENS: None   INDICATIONS FOR PROCEDURE: The patient has  LEFT KNEE AVASCULAR NECROSIS , varus deformities, XR shows bone on bone arthritis. Patient has failed all conservative measures including anti-inflammatory medicines, narcotics, attempts at  exercise and weight loss, cortisone injections and viscosupplementation.  Risks and benefits of surgery have been discussed, questions answered.   DESCRIPTION OF PROCEDURE: The patient identified by armband, received  IV antibiotics, [facility]. Patient taken to the operating room, appropriate anesthetic  monitors were attached, and general endotracheal anesthesia induced with  the patient in supine position, Foley catheter was inserted. Tourniquet  applied high to the operative thigh. Lateral post and foot positioner  applied to the table, the lower extremity was then prepped and draped  in usual sterile  fashion from the ankle to the tourniquet. Time-out procedure was performed. The limb was wrapped with an Esmarch bandage and the tourniquet inflated to 350 mmHg. We began the operation by making the anterior midline incision starting at handbreadth above the patella going over the patella 1 cm medial to and  4 cm distal to the tibial tubercle. Small bleeders in the skin and the  subcutaneous tissue identified and cauterized. Transverse retinaculum was incised and reflected medially and a medial parapatellar arthrotomy was accomplished. the patella was everted and theprepatellar fat pad resected. The superficial medial collateral  ligament was then elevated from anterior to posterior along the proximal  flare of the tibia and anterior half of the menisci resected. The knee was hyperflexed exposing bone on bone arthritis. Peripheral and notch osteophytes as well as the cruciate ligaments were then resected. We continued to  work our way around posteriorly along the proximal tibia, and externally  rotated the tibia subluxing it out from underneath the femur. A McHale  retractor was placed through the notch and a lateral Hohmann retractor  placed, and we then drilled through the proximal tibia in line with the  axis of the tibia followed by an intramedullary guide rod and 2-degree  posterior slope cutting guide. The tibial cutting guide was pinned into place  allowing resection of 10 mm of bone medially and about 10 mm of bone  laterally because of her varus deformity. Satisfied with the tibial resection, we then  entered the distal femur 2 mm anterior to the PCL origin with the  intramedullary guide rod and applied the distal femoral cutting guide  set at 11mm, with 5 degrees of valgus. This was pinned along the  epicondylar axis. At this point, the distal femoral cut was accomplished without difficulty. We then sized for a #2.5L femoral component and pinned the guide in 0 degrees of external  rotation.The chamfer cutting guide was pinned into place. The anterior, posterior, and chamfer cuts were accomplished without difficulty followed by  the box cutting guide and the box cut. We also removed posterior osteophytes from the posterior femoral condyles. At this  time, the knee was brought into full extension. We checked our  extension and flexion gaps and found them symmetric at 10mm.  The patella thickness measured at 23 mm. We set the cutting guide at 14 and removed the posterior 9 mm  of the patella, sized for a 35 button and drilled the lollipop. The knee  was then once again hyperflexed exposing the proximal tibia. We sized for a #2.5 tibial base plate, applied the smokestack and the conical reamer followed by the the Delta fin keel punch. We then hammered into place the Sigma RP trial femoral component, inserted a 10-mm trial bearing, trial patellar button, and took the knee through range of motion from 0-130 degrees. No thumb pressure was required for patellar  tracking. At this point, all trial components were removed, a double batch of DePuy HV cement with 1500 mg of Zinacef was mixed and applied to all bony metallic mating surfaces except for the posterior condyles of the femur itself. In order, we  hammered into place the tibial tray and removed excess cement, the femoral component and removed excess cement, a 10-mm Sigma RP bearing  was inserted, and the knee brought to full extension with compression.  The patellar button was clamped into place, and excess cement  removed. While the cement cured the wound was irrigated out with normal saline solution pulse lavage, and medium Hemovac drains were placed from an anterolateral  approach. Ligament stability and patellar tracking were checked and found to be excellent. The parapatellar arthrotomy was closed with  running #1 Vicryl suture. The subcutaneous tissue with 0 and 2-0 undyed  Vicryl suture, and the skin with skin staples. A  dressing of Xeroform,  4 x 4, dressing sponges, Webril, and Ace wrap applied. The patient  awakened, extubated, and taken to recovery room without difficulty.   Alashia Brownfield J 03/04/2014, 11:05 AM

## 2014-03-04 NOTE — Progress Notes (Signed)
Orthopedic Tech Progress Note Patient Details:  Kathrin PennerSheila Gloria November 28, 1955 [id number]  CPM Left Knee CPM Left Knee: On Left Knee Flexion (Degrees): 60 Left Knee Extension (Degrees): 0 Additional Comments: trapeze bar patient helper Viewed order from doctor's order list  Nikki DomCrawford, Merrik Puebla 03/04/2014, 12:19 PM

## 2014-03-04 NOTE — Anesthesia Preprocedure Evaluation (Addendum)
Anesthesia Evaluation  Patient identified by MRN, date of birth, ID band Patient awake    Reviewed: Allergy & Precautions, H&P , NPO status , Patient's Chart, lab work & pertinent test results  Airway Mallampati: II  TM Distance: >3 FB Neck ROM: Full    Dental no notable dental hx.    Pulmonary Current Smoker,  breath sounds clear to auscultation  Pulmonary exam normal       Cardiovascular Rhythm:Regular Rate:Normal     Neuro/Psych Bipolar Disorder negative neurological ROS  negative psych ROS   GI/Hepatic Neg liver ROS, GERD-  Medicated,  Endo/Other  negative endocrine ROS  Renal/GU negative Renal ROS  negative genitourinary   Musculoskeletal negative musculoskeletal ROS (+)   Abdominal   Peds negative pediatric ROS (+)  Hematology negative hematology ROS (+)   Anesthesia Other Findings   Reproductive/Obstetrics negative OB ROS                            Anesthesia Physical Anesthesia Plan  ASA: II  Anesthesia Plan: General   Post-op Pain Management:    Induction: Intravenous  Airway Management Planned: LMA  Additional Equipment:   Intra-op Plan:   Post-operative Plan:   Informed Consent: I have reviewed the patients History and Physical, chart, labs and discussed the procedure including the risks, benefits and alternatives for the proposed anesthesia with the patient or authorized representative who has indicated his/her understanding and acceptance.   Dental advisory given  Plan Discussed with: CRNA and Surgeon  Anesthesia Plan Comments:         Anesthesia Quick Evaluation

## 2014-03-04 NOTE — Plan of Care (Signed)
Problem: Phase I Progression Outcomes Goal: Dangle or out of bed evening of surgery Outcome: Completed/Met Date Met:  03/04/14  Problem: Phase II Progression Outcomes Goal: Ambulates Outcome: Completed/Met Date Met:  03/04/14

## 2014-03-04 NOTE — Transfer of Care (Signed)
Immediate Anesthesia Transfer of Care Note  Patient: Samantha PennerSheila Michael  Procedure(s) Performed: Procedure(s): LEFT TOTAL KNEE ARTHROPLASTY (Left)  Patient Location: PACU  Anesthesia Type:General  Level of Consciousness: awake and alert   Airway & Oxygen Therapy: Patient Spontanous Breathing and Patient connected to nasal cannula oxygen  Post-op Assessment: Report given to PACU RN and Post -op Vital signs reviewed and stable  Post vital signs: Reviewed and stable  Complications: No apparent anesthesia complications

## 2014-03-04 NOTE — Care Management Note (Signed)
CARE MANAGEMENT NOTE 03/04/2014  Patient:  Samantha Michael,Samantha Michael   Account Number:  [id number]  Date Initiated:  03/04/2014  Documentation initiated by:  Vance PeperBRADY,Rajni Holsworth  Subjective/Objective Assessment:   58 yr old female admitted with vascular necrosis of the left knee. Patient had a left total knee arthroplasty.     Action/Plan:   PT/OT eval.  Case manager will continue to monitor.   Anticipated DC Date:  03/06/2014   Anticipated DC Plan:  HOME W HOME HEALTH SERVICES      DC Planning Services  CM consult      Choice offered to / List presented to:             Status of service:  In process, will continue to follow Medicare Important Message given?   (If response is "NO", the following Medicare IM given date fields will be blank) Date Medicare IM given:   Medicare IM given by:   Date Additional Medicare IM given:   Additional Medicare IM given by:    Discharge Disposition:    Per UR Regulation:  Reviewed for med. necessity/level of care/duration of stay

## 2014-03-04 NOTE — Interval H&P Note (Signed)
History and Physical Interval Note:  03/04/2014 8:50 AM  Samantha PennerSheila Spohr  has presented today for surgery, with the diagnosis of LEFT KNEE AVASCULAR NECROSIS   The various methods of treatment have been discussed with the patient and family. After consideration of risks, benefits and other options for treatment, the patient has consented to  Procedure(s): LEFT TOTAL KNEE ARTHROPLASTY (Left) as a surgical intervention .  The patient's history has been reviewed, patient examined, no change in status, stable for surgery.  I have reviewed the patient's chart and labs.  Questions were answered to the patient's satisfaction.     Nestor LewandowskyOWAN,Brentin Shin J

## 2014-03-04 NOTE — Plan of Care (Signed)
Problem: Consults Goal: Diagnosis- Total Joint Replacement Primary Total Knee Left  Problem: Phase I Progression Outcomes Goal: CMS/Neurovascular status WDL Outcome: Completed/Met Date Met:  03/04/14 Goal: Hemodynamically stable Outcome: Completed/Met Date Met:  03/04/14     

## 2014-03-04 NOTE — Anesthesia Postprocedure Evaluation (Signed)
  Anesthesia Post-op Note  Patient: Samantha PennerSheila Michael  Procedure(s) Performed: Procedure(s) (LRB): LEFT TOTAL KNEE ARTHROPLASTY (Left)  Patient Location: PACU  Anesthesia Type: GA combined with regional for post-op pain  Level of Consciousness: awake and alert   Airway and Oxygen Therapy: Patient Spontanous Breathing  Post-op Pain: mild  Post-op Assessment: Post-op Vital signs reviewed, Patient's Cardiovascular Status Stable, Respiratory Function Stable, Patent Airway and No signs of Nausea or vomiting  Last Vitals:  Filed Vitals:   03/04/14 1200  BP: 107/81  Pulse: 91  Temp:   Resp: 9    Post-op Vital Signs: stable   Complications: No apparent anesthesia complications

## 2014-03-05 ENCOUNTER — Encounter (HOSPITAL_COMMUNITY): Payer: Self-pay | Admitting: Orthopedic Surgery

## 2014-03-05 LAB — CBC
HCT: 32.1 % — ABNORMAL LOW (ref 36.0–46.0)
Hemoglobin: 10.7 g/dL — ABNORMAL LOW (ref 12.0–15.0)
MCH: 30.6 pg (ref 26.0–34.0)
MCHC: 33.3 g/dL (ref 30.0–36.0)
MCV: 91.7 fL (ref 78.0–100.0)
PLATELETS: 205 10*3/uL (ref 150–400)
RBC: 3.5 MIL/uL — AB (ref 3.87–5.11)
RDW: 12.9 % (ref 11.5–15.5)
WBC: 8.4 10*3/uL (ref 4.0–10.5)

## 2014-03-05 NOTE — Progress Notes (Signed)
Physical Therapy Treatment Patient Details Name: Samantha PennerSheila Ticas MRN: [id number] DOB: 27-Mar-1956 Today's Date: 03/05/2014    History of Present Illness Patient is a 58 yo female admitted 03/04/14 now s/p Lt TKA.  PMH:  Rt rotator cuff repair, wrist fx surgery, bipolar disorder, depression, anxiety    PT Comments    Pt is POD #1 and is moving well, min assist with RW.  She does still have some moments of instability when on her feet that require assist to prevent a fall, but she is steadily improving and plans to have her husband's assist for 1 week at discharge.  PT will continue to follow acutely to progress gait and exercises.   Follow Up Recommendations  Home health PT;Supervision/Assistance - 24 hour     Equipment Recommendations  Rolling walker with 5" wheels (already delivered to room)    Recommendations for Other Services   NA     Precautions / Restrictions Precautions Precautions: Knee;Fall Precaution Booklet Issued: Yes (comment) Precaution Comments: knee handout given and reviewed.  Pt with h/o falls due to left knee buckling.  Restrictions Weight Bearing Restrictions: Yes LLE Weight Bearing: Weight bearing as tolerated    Mobility  Bed Mobility               General bed mobility comments: pt in recliner  Transfers Overall transfer level: Needs assistance Equipment used: Rolling walker (2 wheeled) Transfers: Sit to/from Stand Sit to Stand: Min guard         General transfer comment: Min guard assist for safety during transitions.  Verbal cues for safe hand placement both from recliner chair and from toilet.   Ambulation/Gait Ambulation/Gait assistance: Min assist Ambulation Distance (Feet): 100 Feet Assistive device: Rolling walker (2 wheeled) Gait Pattern/deviations: Step-through pattern;Trunk flexed Gait velocity: decreased Gait velocity interpretation: Below normal speed for age/gender General Gait Details: Verbal cues for upright posture, safe LE  sequencing and RW use and Min assist needed to support trunk as pt had 2 hyperextension moments (due to ppor quad control) that were very painful and made her almost buckle due to pain during gait.           Balance Overall balance assessment: Needs assistance Sitting-balance support: Feet supported;No upper extremity supported Sitting balance-Leahy Scale: Good     Standing balance support: No upper extremity supported;Bilateral upper extremity supported;Single extremity supported Standing balance-Leahy Scale: Fair Standing balance comment: pt needs rw for balance                    Cognition Arousal/Alertness: Awake/alert Behavior During Therapy: WFL for tasks assessed/performed Overall Cognitive Status: Within Functional Limits for tasks assessed                      Exercises Total Joint Exercises Ankle Circles/Pumps: AROM;Both;10 reps;Seated Quad Sets: AROM;Left;10 reps;Seated Heel Slides: AAROM;Left;10 reps;Seated    General Comments General comments (skin integrity, edema, etc.): Pt noted to be shivering throughout session. Pt attributed it to feeling cold, OT informed nursing. Pt also reported a general feeling of weakness.      Pertinent Vitals/Pain Pain Assessment: 0-10 Pain Score: 7  Pain Location: knee left Pain Descriptors / Indicators: Aching Pain Intervention(s): Limited activity within patient's tolerance    Home Living Family/patient expects to be discharged to:: Private residence Living Arrangements: Spouse/significant other Available Help at Discharge: Family;Available 24 hours/day Type of Home: House Home Access: Level entry   Home Layout: Two level;Able to live on main  level with bedroom/bathroom Home Equipment: Walker - 2 wheels;Wheelchair - manual;Crutches;Cane - single point;Other (comment) (BSC has been delivered)      Prior Function Level of Independence: Independent          PT Goals (current goals can now be found in  the care plan section) Acute Rehab PT Goals Patient Stated Goal: to go home, get her knee stronger to prevent falls.  Progress towards PT goals: Progressing toward goals    Frequency  7X/week    PT Plan Current plan remains appropriate       End of Session Equipment Utilized During Treatment: Gait belt Activity Tolerance: Patient limited by fatigue;Patient limited by pain Patient left: in chair;with call bell/phone within reach;with family/visitor present     Time: [id number] PT Time Calculation (min) (ACUTE ONLY): 36 min  Charges:  $Gait Training: 8-22 mins $Therapeutic Activity: 8-22 mins            Sydney Azure B. Milicent Acheampong, PT, DPT [phone]   03/05/2014, 2:03 PM

## 2014-03-05 NOTE — Progress Notes (Signed)
Patient ID: Samantha PennerSheila Michael, female   DOB: 04-01-1956, 58 y.o.   MRN: [id number] PATIENT ID: Samantha PennerSheila Michael  MRN: [id number]  DOB/AGE:  59-09-1955 / 58 y.o.  1 Day Post-Op Procedure(s) (LRB): LEFT TOTAL KNEE ARTHROPLASTY (Left)    PROGRESS NOTE Subjective: Patient is alert, oriented, no Nausea, no Vomiting, yes passing gas, no Bowel Movement. Taking PO well. Denies SOB, Chest or Calf Pain. Using Incentive Spirometer, PAS in place. Ambulate to BR x 3, CPM 0-04 Patient reports pain as 3 on 0-10 scale  .    Objective: Vital signs in last 24 hours: Filed Vitals:   03/04/14 1311 03/04/14 2017 03/05/14 0155 03/05/14 0535  BP: 90/53 111/53 110/55 110/54  Pulse: 88 71 86 85  Temp: 97.5 F (36.4 C) 98.5 F (36.9 C) 97.9 F (36.6 C) 98.2 F (36.8 C)  TempSrc: Axillary     Resp: 15 15 15 15   Weight:      SpO2: 95% 98% 97% 94%      Intake/Output from previous day: I/O last 3 completed shifts: In: 2990 [P.O.:240; I.V.:2750] Out: 341 [Urine:1; Drains:300; Blood:40]   Intake/Output this shift:     LABORATORY DATA: No results for input(s): WBC, HGB, HCT, PLT, NA, K, CL, CO2, BUN, CREATININE, GLUCOSE, GLUCAP, INR, CALCIUM in the last 72 hours.  Invalid input(s): PT, 2  Examination: Neurologically intact ABD soft Neurovascular intact Sensation intact distally Intact pulses distally Dorsiflexion/Plantar flexion intact Incision: dressing C/D/I No cellulitis present Compartment soft} Blood and plasma separated in drain indicating minimal recent drainage, drain pulled without difficulty.  Assessment:   1 Day Post-Op Procedure(s) (LRB): LEFT TOTAL KNEE ARTHROPLASTY (Left) ADDITIONAL DIAGNOSIS: Expected Acute Blood Loss Anemia,   Plan: PT/OT WBAT, CPM 5/hrs day until ROM 0-90 degrees, then D/C CPM DVT Prophylaxis:  SCDx72hrs, ASA 325 mg BID x 2 weeks DISCHARGE PLAN: Home DISCHARGE NEEDS: HHPT, CPM, Walker and 3-in-1 comode seat     Camrynn Mcclintic J 03/05/2014, 7:12 AM

## 2014-03-05 NOTE — Progress Notes (Signed)
Physical Therapy Treatment Patient Details Name: Samantha PennerSheila Bouchie MRN: [id number] DOB: [birth year] Today's Date: 03/05/2014    History of Present Illness Patient is a 58 yo female admitted 03/04/14 now s/p Lt TKA.  PMH:  Rt rotator cuff repair, wrist fx surgery, bipolar disorder, depression, anxiety    PT Comments    Pt is very limited during her PM session by pain.  RN made aware and looking into pain meds (she was recently medicated with p.o. Meds).  Pt now min assist with RW for gait and guarded against knee flexion ROM.  PT will continue to follow acutely and husband will be home to assist pt, so continue to recommend HHPT at discharge.   Follow Up Recommendations  Home health PT;Supervision/Assistance - 24 hour     Equipment Recommendations  None recommended by PT    Recommendations for Other Services   NA     Precautions / Restrictions Precautions Precautions: Knee;Fall Restrictions LLE Weight Bearing: Weight bearing as tolerated    Mobility  Bed Mobility Overal bed mobility: Needs Assistance Bed Mobility: Sit to Supine       Sit to supine: Min assist   General bed mobility comments: Min assist to help lift left leg into the bed from sitting.   Transfers Overall transfer level: Needs assistance Equipment used: Rolling walker (2 wheeled) Transfers: Sit to/from Stand Sit to Stand: Min assist         General transfer comment: Min assit to get to standing from toilet over painful leg.  Verbal cues for safe hand placement.   Ambulation/Gait Ambulation/Gait assistance: Min assist Ambulation Distance (Feet): 15 Feet Assistive device: Rolling walker (2 wheeled) Gait Pattern/deviations: Step-to pattern;Antalgic;Trunk flexed Gait velocity: decreased Gait velocity interpretation: Below normal speed for age/gender General Gait Details: Verbal cues for upright posture, to help control RW and cues for safe RW use and leg sequencing.  Pt much slower this PM and seems to be  in much more pain during gait and mobility.        Balance Overall balance assessment: Needs assistance Sitting-balance support: Feet supported;No upper extremity supported Sitting balance-Leahy Scale: Good     Standing balance support: Bilateral upper extremity supported Standing balance-Leahy Scale: Poor Standing balance comment: needs support of RW and therapist in standing this PM.                     Cognition Arousal/Alertness: Lethargic;Suspect due to medications Behavior [facility] tasks assessed/performed Overall Cognitive Status: Within Functional Limits for tasks assessed                      Exercises Total Joint Exercises Ankle Circles/Pumps: AROM;AAROM;Right;Left;Both;10 reps;Supine Quad Sets: AROM;Left;5 reps;Supine Heel Slides: AAROM;Left;10 reps;Supine (pt guarding at end ROM) Hip ABduction/ADduction: AAROM;Left;10 reps;Supine (pt guarding during end ROM of this exercise as well) Goniometric ROM: ~45 degrees with hard end feel as pt is pushing back against the therapist due to pain.         Pertinent Vitals/Pain Pain Assessment: Faces Faces Pain Scale: Hurts worst Pain Location: left knee Pain Descriptors / Indicators: Aching Pain Intervention(s): Limited activity within patient's tolerance;Monitored during session;Repositioned;Patient requesting pain meds-RN notified           PT Goals (current goals can now be found in the care plan section) Acute Rehab PT Goals Patient Stated Goal: to go home, get her knee stronger to prevent falls.  Progress towards PT goals: Progressing toward goals  Frequency  7X/week    PT Plan Current plan remains appropriate       End of Session Equipment Utilized During Treatment: Gait belt Activity Tolerance: Patient limited by fatigue;Patient limited by pain Patient left: in bed;in CPM;with call bell/phone within reach     Time: [id number] PT Time Calculation (min) (ACUTE ONLY):  29 min  Charges:  $Gait Training: 8-22 mins $Therapeutic Exercise: 8-22 mins                      Ivar Domangue B. Bhakti Labella, PT, DPT [phone]   03/05/2014, 5:57 PM

## 2014-03-05 NOTE — Evaluation (Signed)
Occupational Therapy Evaluation Patient Details Name: Samantha PennerSheila Michael MRN: [id number] DOB: 1955-07-31 Today's Date: 03/05/2014    History of Present Illness Patient is a 58 yo female admitted 03/04/14 now s/p Lt TKA.  PMH:  Rt rotator cuff repair, wrist fx surgery, bipolar disorder, depression, anxiety   Clinical Impression   Pt admitted with the above diagnoses and presents with below problem list. Pt will benefit from continued acute OT to address the below listed deficits and maximize independence with basic ADLs prior to d/c home with family providing 24 hour assistance. PTA pt was independent with ADLs. Currently pt at min A level for LB ADLs, functional mobility and transfers. Pt needing min A at times for balance during ambulation to/from toilet and during toilet hygenie/clothing manipulation.       Follow Up Recommendations  Supervision/Assistance - 24 hour;No OT follow up    Equipment Recommendations  Other (comment) (recommended 3n1 already delivered to pt)    Recommendations for Other Services       Precautions / Restrictions Precautions Precautions: Knee;Fall Precaution Comments: reviewed Restrictions Weight Bearing Restrictions: Yes LLE Weight Bearing: Weight bearing as tolerated      Mobility Bed Mobility               General bed mobility comments: pt in recliner  Transfers Overall transfer level: Needs assistance Equipment used: Rolling walker (2 wheeled) Transfers: Sit to/from Stand Sit to Stand: Min assist         General transfer comment: assist for balance to stand and to control descent    Balance Overall balance assessment: Needs assistance         Standing balance support: Bilateral upper extremity supported;During functional activity Standing balance-Leahy Scale: Poor Standing balance comment: pt needs rw for balance                            ADL Overall ADL's : Needs assistance/impaired Eating/Feeding: Set  up;Sitting   Grooming: Min guard;Standing Grooming Details (indicate cue type and reason): 1 LOB episode while standing at sink, pt needed min A to steady Upper Body Bathing: Set up;Sitting   Lower Body Bathing: Minimal assistance;With adaptive equipment;Sit to/from stand   Upper Body Dressing : Set up;Sitting   Lower Body Dressing: With adaptive equipment;Minimal assistance;Sit to/from stand   Toilet Transfer: Minimal assistance;Ambulation;RW (3n1 over toilet)   Toileting- Clothing Manipulation and Hygiene: Minimal assistance;Sit to/from stand Toileting - Clothing Manipulation Details (indicate cue type and reason): min A for balance Tub/ Shower Transfer: Minimal assistance;Ambulation;3 in 1;Rolling walker   Functional mobility during ADLs: Minimal assistance;Rolling walker General ADL Comments: Pt at min A level for LB ADLs, home distance ambulaiton and functional transfers for assistance with balance. Education on techniques and AE for completion of ADLs provided to pt. Discussed home set up safety for toilet and shower transfers, removal of throw rugs, bag over rw, having a chair stationed at halfway point for any longer in home ambulation distances, having someone with her while OOB/mobility.     Vision                     Perception     Praxis      Pertinent Vitals/Pain Pain Assessment: 0-10 Pain Score: 6  Pain Location: Lt knee Pain Descriptors / Indicators: Aching Pain Intervention(s): Limited activity within patient's tolerance;Monitored during session;Repositioned     Hand Dominance     Extremity/Trunk Assessment Upper Extremity  Assessment Upper Extremity Assessment: Generalized weakness   Lower Extremity Assessment Lower Extremity Assessment: Defer to PT evaluation       Communication Communication Communication: No difficulties   Cognition Arousal/Alertness: Lethargic;Suspect due to medications Behavior During Therapy: [facility]WFL for tasks  assessed/performed Overall Cognitive Status: Within Functional Limits for tasks assessed                     General Comments       Exercises       Shoulder Instructions      Home Living Family/patient expects to be discharged to:: Private residence Living Arrangements: Spouse/significant other Available Help at Discharge: Family;Available 24 hours/day Type of Home: House Home Access: Level entry     Home Layout: Two level;Able to live on main level with bedroom/bathroom     Bathroom Shower/Tub: Producer, television/film/videoWalk-in shower   Bathroom Toilet: Standard Bathroom Accessibility: Yes How Accessible: Accessible via walker Home Equipment: Walker - 2 wheels;Wheelchair - manual;Crutches;Cane - single point;Other (comment) (BSC has been delivered)          Prior Functioning/Environment Level of Independence: Independent             OT Diagnosis: Generalized weakness;Acute pain   OT Problem List: Decreased activity tolerance;Impaired balance (sitting and/or standing);Decreased safety awareness;Decreased knowledge of use of DME or AE;Decreased knowledge of precautions;Pain   OT Treatment/Interventions: Self-care/ADL training;Energy conservation;DME and/or AE instruction;Therapeutic activities;Patient/family education;Balance training    OT Goals(Current goals can be found in the care plan section) Acute Rehab OT Goals Patient Stated Goal: not stated OT Goal Formulation: With patient Time For Goal Achievement: 03/12/14 Potential to Achieve Goals: Fair ADL Goals Pt Will Perform Grooming: with min guard assist;standing Pt Will Perform Lower Body Dressing: with min guard assist;with adaptive equipment;sit to/from stand Pt Will Transfer to Toilet: with min guard assist;ambulating (3n1 over toilet) Pt Will Perform Toileting - Clothing Manipulation and hygiene: with min guard assist;sit to/from stand Pt Will Perform Tub/Shower Transfer: with min guard assist;ambulating;3 in 1;rolling  walker  OT Frequency: Min 3X/week   Barriers to D/C:            Co-evaluation              End of Session Equipment Utilized During Treatment: Gait belt;Rolling walker Nurse Communication: Other (comment) (pt shivering, LOB during ambulation)  Activity Tolerance: Patient tolerated treatment well;Patient limited by pain;Patient limited by fatigue Patient left: in chair;with call bell/phone within reach;with family/visitor present   Time: 1610-96041145-1212 OT Time Calculation (min): 27 min Charges:  OT General Charges $OT Visit: 1 Procedure OT Evaluation $Initial OT Evaluation Tier I: 1 Procedure OT Treatments $Self Care/Home Management : 8-22 mins G-Codes:    Pilar GrammesMathews, Meshilem Machuca H 03/05/2014, 12:33 PM

## 2014-03-05 NOTE — Plan of Care (Signed)
Problem: Phase I Progression Outcomes Goal: Pain controlled with appropriate interventions Outcome: Completed/Met Date Met:  03/05/14 Goal: Initial discharge plan identified Outcome: Completed/Met Date Met:  03/05/14 Goal: Other Phase I Outcomes/Goals Outcome: Completed/Met Date Met:  03/05/14  Problem: Phase II Progression Outcomes Goal: Tolerating diet Outcome: Completed/Met Date Met:  03/05/14

## 2014-03-06 ENCOUNTER — Encounter (HOSPITAL_COMMUNITY): Payer: Self-pay | Admitting: General Practice

## 2014-03-06 LAB — CBC
HCT: 30.2 % — ABNORMAL LOW (ref 36.0–46.0)
HEMOGLOBIN: 10.2 g/dL — AB (ref 12.0–15.0)
MCH: 30.6 pg (ref 26.0–34.0)
MCHC: 33.8 g/dL (ref 30.0–36.0)
MCV: 90.7 fL (ref 78.0–100.0)
Platelets: 177 10*3/uL (ref 150–400)
RBC: 3.33 MIL/uL — ABNORMAL LOW (ref 3.87–5.11)
RDW: 12.7 % (ref 11.5–15.5)
WBC: 7.5 10*3/uL (ref 4.0–10.5)

## 2014-03-06 NOTE — Progress Notes (Signed)
PATIENT ID: Samantha Michael  MRN: [id number]  DOB/AGE:  1955/10/26 / 58 y.o.  2 Days Post-Op Procedure(s) (LRB): LEFT TOTAL KNEE ARTHROPLASTY (Left)    PROGRESS NOTE Subjective: Patient is alert, oriented, no Nausea, no Vomiting, yes passing gas, no Bowel Movement. Taking PO well. Denies SOB, Chest or Calf Pain. Using Incentive Spirometer, PAS in place. Ambulate WBAT with pt walking 100 ft yesterday, CPM 0-60 Patient reports pain as 4 on 0-10 scale  .    Objective: Vital signs in last 24 hours: Filed Vitals:   03/05/14 2118 03/05/14 2151 03/06/14 0234 03/06/14 0502  BP: 139/59   144/65  Pulse: 100   97  Temp:  99.5 F (37.5 C) 98.4 F (36.9 C) 99.3 F (37.4 C)  TempSrc:   Oral   Resp: 18   18  Weight:      SpO2: 93%   93%      Intake/Output from previous day: I/O last 3 completed shifts: In: 3074 [P.O.:600; I.V.:1250] Out: 34 [Drains:50]   Intake/Output this shift:     LABORATORY DATA:  Recent Labs  03/05/14 0800 03/06/14 0605  WBC 8.4 7.5  HGB 10.7* 10.2*  HCT 32.1* 30.2*  PLT 205 177    Examination: Neurologically intact Neurovascular intact Sensation intact distally Intact pulses distally Dorsiflexion/Plantar flexion intact Incision: scant drainage No cellulitis present Compartment soft}  Assessment:   2 Days Post-Op Procedure(s) (LRB): LEFT TOTAL KNEE ARTHROPLASTY (Left) ADDITIONAL DIAGNOSIS: Expected Acute Blood Loss Anemia, depression, bipolar, anxiety  Plan: PT/OT WBAT, CPM 5/hrs day until ROM 0-90 degrees, then D/C CPM DVT Prophylaxis:  SCDx72hrs, ASA 325 mg BID x 2 weeks DISCHARGE PLAN: Home, once pt has met pt goals. DISCHARGE NEEDS: HHPT, HHRN, CPM, Walker and 3-in-1 comode seat     Maher Shon R 03/06/2014, 7:57 AM

## 2014-03-06 NOTE — Discharge Summary (Signed)
Patient ID: Samantha Michael MRN: [id number] DOB/AGE: 1956/01/04 58 y.o.  Admit date: 03/04/2014 Discharge date: 03/06/2014  Admission Diagnoses:  Principal Problem:   Arthritis of knee, left Primary   Discharge Diagnoses:  Same  Past Medical History  Diagnosis Date  . Complication of anesthesia   . PONV (postoperative nausea and vomiting)   . GERD (gastroesophageal reflux disease)   . History of hiatal hernia   . Diverticulosis   . Gastritis   . Depression   . Bipolar disorder   . Anxiety     Surgeries: Procedure(s): LEFT TOTAL KNEE ARTHROPLASTY on 03/04/2014   Consultants:    Discharged Condition: Improved  Hospital Course: Samantha Michael is an 58 y.o. female who was admitted 03/04/2014 for operative treatment ofArthritis of knee, left. Patient has severe unremitting pain that affects sleep, daily activities, and work/hobbies. After pre-op clearance the patient was taken to the operating room on 03/04/2014 and underwent  Procedure(s): LEFT TOTAL KNEE ARTHROPLASTY.    Patient was given perioperative antibiotics: Anti-infectives    Start     Dose/Rate Route Frequency Ordered Stop   03/04/14 0600  vancomycin (VANCOCIN) IVPB 1000 mg/200 mL premix     1,000 mg200 mL/hr over 60 Minutes Intravenous On call to O.R. 03/03/14 1434 03/04/14 1038       Patient was given sequential compression devices, early ambulation, and chemoprophylaxis to prevent DVT.  Patient benefited maximally from hospital stay and there were no complications.    Recent vital signs: Patient Vitals for the past 24 hrs:  BP Temp Temp src Pulse Resp SpO2  03/06/14 0502 (!) 144/65 mmHg 99.3 F (37.4 C) - 97 18 93 %  03/06/14 0234 - 98.4 F (36.9 C) Oral - - -  03/05/14 2151 - 99.5 F (37.5 C) - - - -  03/05/14 2118 (!) 139/59 mmHg - - 100 18 93 %  03/05/14 2022 - (!) 101.1 F (38.4 C) Oral - - -  03/05/14 1930 - (!) 102.9 F (39.4 C) - - - -  03/05/14 1600 - - - - 18 -  03/05/14 1257 (!) 104/57 mmHg  98.7 F (37.1 C) Oral 93 18 95 %  03/05/14 1200 - - - - 18 -     Recent laboratory studies:  Recent Labs  03/05/14 0800 03/06/14 0605  WBC 8.4 7.5  HGB 10.7* 10.2*  HCT 32.1* 30.2*  PLT 205 177     Discharge Medications:     Medication List    TAKE these medications        albuterol 108 (90 BASE) MCG/ACT inhaler  Commonly known as:  PROVENTIL HFA;VENTOLIN HFA  Inhale 2 puffs into the lungs every 6 (six) hours as needed for wheezing.     aspirin EC 325 MG tablet  Take 1 tablet (325 mg total) by mouth 2 (two) times daily.     atorvastatin 20 MG tablet  Commonly known as:  LIPITOR  Take 20 mg by mouth daily.     clonazePAM 1 MG tablet  Commonly known as:  KLONOPIN  Take 1 mg by mouth 3 (three) times daily.     CRANBERRY PO  Take 1 tablet by mouth 2 (two) times daily.     escitalopram 20 MG tablet  Commonly known as:  LEXAPRO  Take 20 mg by mouth daily.     METAMUCIL PO  Take 1 tablet by mouth daily.     MUCINEX MAXIMUM STRENGTH 1200 MG Tb12  Generic drug:  Guaifenesin  Take 1,200 mg by mouth daily.     omeprazole 20 MG capsule  Commonly known as:  PRILOSEC  Take 20 mg by mouth daily.     oxyCODONE-acetaminophen 5-325 MG per tablet  Commonly known as:  ROXICET  Take 1 tablet by mouth every 4 (four) hours as needed.     polyethylene glycol packet  Commonly known as:  MIRALAX / GLYCOLAX  Take 17 g by mouth daily.     tizanidine 2 MG capsule  Commonly known as:  ZANAFLEX  Take 1 capsule (2 mg total) by mouth 3 (three) times daily.     topiramate 100 MG tablet  Commonly known as:  TOPAMAX  Take 100-200 mg by mouth 2 (two) times daily. Take 100 mg every morning and 200 mg every evening        Diagnostic Studies: Dg Chest 2 View  02/25/2014   CLINICAL DATA:  Preop for left knee replacement on 03/04/2014. Smoker.Preop testing Z01.818 (ICD-10-CM)  EXAM: CHEST  2 VIEW  COMPARISON:  None  FINDINGS: Mild hyperinflation. Midline trachea. Normal heart size.  Atherosclerosis in the transverse aorta. Mediastinal contours otherwise within normal limits. Minimal biapical pleural thickening. Lower lobe predominant interstitial thickening. Vessel on end versus calcified granuloma within the left upper lobe.  IMPRESSION: COPD/chronic bronchitis, without acute superimposed process.  Aortic atherosclerosis.   Electronically Signed   By: Jeronimo GreavesKyle  Talbot M.D.   On: 02/25/2014 17:25    Disposition: Final discharge disposition not confirmed      Discharge Instructions    CPM    Complete by:  As directed   Continuous passive motion machine (CPM):      Use the CPM from 0 to 60  for 5 hours per day.      You may increase by 10 degrees per day.  You may break it up into 2 or 3 sessions per day.      Use CPM for 2 weeks or until you are told to stop.     Call MD / Call 911    Complete by:  As directed   If you experience chest pain or shortness of breath, CALL 911 and be transported to the hospital emergency room.  If you develope a fever above 101 F, pus (white drainage) or increased drainage or redness at the wound, or calf pain, call your surgeon's office.     Change dressing    Complete by:  As directed   Change dressing on 5, then change the dressing daily with sterile 4 x 4 inch gauze dressing and apply TED hose.  You may clean the incision with alcohol prior to redressing.     Constipation Prevention    Complete by:  As directed   Drink plenty of fluids.  Prune juice may be helpful.  You may use a stool softener, such as Colace (over the counter) 100 mg twice a day.  Use MiraLax (over the counter) for constipation as needed.     Diet - low sodium heart healthy    Complete by:  As directed      Discharge instructions    Complete by:  As directed   Follow up in office with Dr. Turner Danielsowan in 2 weeks.     Driving restrictions    Complete by:  As directed   No driving for 2 weeks     Increase activity slowly as tolerated    Complete by:  As directed       Patient may  shower    Complete by:  As directed   You may shower without a dressing once there is no drainage.  Do not wash over the wound.  If drainage remains, cover wound with plastic wrap and then shower.           Follow-up Information    Follow up with Nestor LewandowskyOWAN,FRANK J, MD In 2 weeks.   Specialty:  Orthopedic Surgery   Contact information:   [address] [place] [address] [phone]        Signed: Vear ClockHILLIPS, ERIC R 03/06/2014, 8:01 AM

## 2014-03-06 NOTE — Progress Notes (Signed)
OT Cancellation Note  Patient Details Name: Samantha PennerSheila Michael MRN: [id number] DOB: [birth year]   Cancelled Treatment:    Reason Eval/Treat Not Completed: Patient declined, no reason specified;Other (comment) (pt stated she finished with PT earlier and wants to rest before d/c.)  Pilar GrammesMathews, Aloise Copus H 03/06/2014, 12:46 PM

## 2014-03-06 NOTE — Progress Notes (Signed)
Physical Therapy Treatment Patient Details Name: Samantha PennerSheila Harney MRN: [id number] DOB: [birth year] Today's Date: 03/06/2014    History of Present Illness Patient is a 58 yo female admitted 03/04/14 now s/p Lt TKA.  PMH:  Rt rotator cuff repair, wrist fx surgery, bipolar disorder, depression, anxiety    PT Comments    Second session today limited by IV pain meds.  Pt having difficulty maintaining focus during exercises.  Helpled her donn her clothing while working on standing balance and safety EOB for discharge.  Plan is to d/c after this therapy session.    Follow Up Recommendations  Home health PT;Supervision/Assistance - 24 hour     Equipment Recommendations  None recommended by PT    Recommendations for Other Services   NA     Precautions / Restrictions Precautions Precautions: Knee;Fall Precaution Booklet Issued: Yes (comment) Precaution Comments: knee handout given and reviewed.  Pt with h/o falls due to left knee buckling.  Restrictions LLE Weight Bearing: Weight bearing as tolerated    Mobility  Bed Mobility Overal bed mobility: Modified Independent Bed Mobility: Supine to Sit     Supine to sit: Modified independent (Device/Increase time) Sit to supine: Supervision   General bed mobility comments: Pt is able to move to EOB using the leg hook technique without external assistance.  She did need cues to do the leg hook.   Transfers Overall transfer level: Needs assistance Equipment used: Rolling walker (2 wheeled) Transfers: Sit to/from Stand Sit to Stand: Min guard         General transfer comment: min guard assist to support trunk for safety during transitions.   Ambulation/Gait Ambulation/Gait assistance: Min guard Ambulation Distance (Feet): 10 Feet Assistive device: Rolling walker (2 wheeled) Gait Pattern/deviations: Step-to pattern;Antalgic;Trunk flexed Gait velocity: decreased Gait velocity interpretation: Below normal speed for age/gender General  Gait Details: Verbal cues for upright posture, assist needed for saftey duiring gait.           Balance Overall balance assessment: Needs assistance Sitting-balance support: Feet supported Sitting balance-Leahy Scale: Good     Standing balance support: Bilateral upper extremity supported;No upper extremity supported Standing balance-Leahy Scale: Poor Standing balance comment: Pt tried taking hands off RW to pull up pants and needed min assist twice to prevent LOB backwards back onto the bed.                     Cognition Arousal/Alertness: Awake/alert Behavior During Therapy: WFL for tasks assessed/performed Overall Cognitive Status: Within Functional Limits for tasks assessed                      Exercises Total Joint Exercises Ankle Circles/Pumps: AROM;Both;10 reps;Supine Quad Sets: AROM;Left;10 reps;Supine Towel Squeeze: AROM;Both;10 reps;Supine Heel Slides: AAROM;Left;10 reps;Supine Long Arc Quad: AROM;Left;10 reps;Seated (5 " holds) Knee Flexion: AROM;AAROM;5 reps;Left;Seated Goniometric ROM: 75 flexion        Pertinent Vitals/Pain Pain Assessment: Faces Pain Score: 5  Faces Pain Scale: Hurts even more Pain Location: left knee Pain Descriptors / Indicators: Aching Pain Intervention(s): Limited activity within patient's tolerance;Monitored during session;Repositioned           PT Goals (current goals can now be found in the care plan section) Acute Rehab PT Goals Patient Stated Goal: to go home, get her knee stronger to prevent falls.  Progress towards PT goals: Progressing toward goals    Frequency  7X/week    PT Plan Current plan remains appropriate  End of Session   Activity Tolerance: Patient limited by fatigue;Patient limited by pain;Patient limited by lethargy Patient left: in chair;with call bell/phone within reach;with family/visitor present     Time: 1620-1700 (-10 mins for RN instruction) PT Time Calculation (min)  (ACUTE ONLY): 40 min  Charges:  $Gait Training: 8-22 mins $Therapeutic Exercise: 23-37 mins            Deronte Solis B. Kevyn Boquet, PT, DPT [phone]   03/06/2014, 5:27 PM

## 2014-03-06 NOTE — Progress Notes (Signed)
Orthopedic Tech Progress Note Patient Details:  Kathrin PennerSheila Roylance Oct 13, 1955 [id number]  Patient ID: Kathrin PennerSheila Ghan, female   DOB: Oct 13, 1955, 58 y.o.   MRN: [id number] Placed pt's lle in cpm @0 -30 degrees; will increase as pt tolerates; rn notified  Nikki DomCrawford, Syanna Remmert 03/06/2014, 2:27 PM

## 2014-03-06 NOTE — Progress Notes (Signed)
Physical Therapy Treatment Patient Details Name: Samantha PennerSheila Michael MRN: [id number] DOB: 04-27-1955 Today's Date: 03/06/2014    History of Present Illness Patient is a 58 yo female admitted 03/04/14 now s/p Lt TKA.  PMH:  Rt rotator cuff repair, wrist fx surgery, bipolar disorder, depression, anxiety    PT Comments    Pt is progressing well with her mobility this morning.  Her pain is better controlled compared to yesterday afternoon.  Pt will have her husband's assist at discharge and continues to be appropriate for HHPT.  PT will continue to follow acutely to progress gait, exercises, and mobility.    Follow Up Recommendations  Home health PT;Supervision/Assistance - 24 hour     Equipment Recommendations  None recommended by PT    Recommendations for Other Services   NA     Precautions / Restrictions Precautions Precautions: Knee;Fall Precaution Booklet Issued: Yes (comment) Precaution Comments: knee handout given and reviewed.  Pt with h/o falls due to left knee buckling.  Restrictions LLE Weight Bearing: Weight bearing as tolerated    Mobility  Bed Mobility Overal bed mobility: Needs Assistance Bed Mobility: Supine to Sit;Sit to Supine     Supine to sit: Supervision Sit to supine: Supervision   General bed mobility comments: supervision for safety, taught pt the opposite leg hooking technique to help progress her weak left leg both into and out of bed using her good right leg.  HOB flat and no rail today to simulate home.   Transfers Overall transfer level: Needs assistance Equipment used: Rolling walker (2 wheeled) Transfers: Sit to/from Stand Sit to Stand: Min guard         General transfer comment: Min guard assist for safety during transitions.  Verbal cues for safe hand placement.   Ambulation/Gait Ambulation/Gait assistance: Min guard Ambulation Distance (Feet): 120 Feet Assistive device: Rolling walker (2 wheeled) Gait Pattern/deviations: Step-to  pattern;Antalgic;Trunk flexed Gait velocity: decreased Gait velocity interpretation: Below normal speed for age/gender General Gait Details: Verbal cues for upright posture, although posture better today than last session.  Assist needed for safety due to abnormal gait pattern.           Balance Overall balance assessment: Needs assistance Sitting-balance support: Feet supported;No upper extremity supported Sitting balance-Leahy Scale: Good     Standing balance support: Bilateral upper extremity supported Standing balance-Leahy Scale: Poor Standing balance comment: needs external assist of at least one hand to stabilize during tasks such as washing her hands, preforming peri care.                     Cognition Arousal/Alertness: Awake/alert Behavior During Therapy: WFL for tasks assessed/performed Overall Cognitive Status: Within Functional Limits for tasks assessed                      Exercises Total Joint Exercises Ankle Circles/Pumps: AROM;Both;20 reps;Supine Heel Slides: AROM;AAROM;10 reps;Seated (10 second holds) Long [employer] Quad: AROM;Left;10 reps;Seated (5 " holds) Goniometric ROM: 75 flexion        Pertinent Vitals/Pain Pain Assessment: 0-10 Pain Score: 5  Pain Location: left knee Pain Descriptors / Indicators: Aching Pain Intervention(s): Limited activity within patient's tolerance;Monitored during session;Repositioned    Home Living Family/patient expects to be discharged to:: Private residence Living Arrangements: Spouse/significant other                      PT Goals (current goals can now be found in the care plan section) Acute  Rehab PT Goals Patient Stated Goal: to go home, get her knee stronger to prevent falls.  Progress towards PT goals: Progressing toward goals    Frequency  7X/week    PT Plan Current plan remains appropriate       End of Session   Activity Tolerance: Patient limited by fatigue;Patient limited by  pain Patient left: with call bell/phone within reach;with family/visitor present;in bed     Time: [id number] PT Time Calculation (min) (ACUTE ONLY): 36 min  Charges:  $Gait Training: 8-22 mins $Therapeutic Exercise: 8-22 mins                      Samantha Michael, PT, DPT [phone]   03/06/2014, 3:33 PM

## 2014-03-13 ENCOUNTER — Encounter (HOSPITAL_COMMUNITY): Payer: Self-pay | Admitting: *Deleted

## 2014-03-13 ENCOUNTER — Ambulatory Visit (HOSPITAL_COMMUNITY)
Admission: RE | Admit: 2014-03-13 | Discharge: 2014-03-13 | Disposition: A | Discharge status: Home / Self Care | Payer: BC Managed Care – PPO | Source: Ambulatory Visit | Attending: Orthopaedic Surgery | Admitting: Orthopaedic Surgery

## 2014-03-13 ENCOUNTER — Other Ambulatory Visit (HOSPITAL_COMMUNITY): Payer: Self-pay | Admitting: Orthopaedic Surgery

## 2014-03-13 ENCOUNTER — Emergency Department (HOSPITAL_COMMUNITY)
Admission: EM | Admit: 2014-03-13 | Discharge: 2014-03-13 | Disposition: A | Discharge status: Home / Self Care | Payer: BC Managed Care – PPO | Attending: Emergency Medicine | Admitting: Emergency Medicine

## 2014-03-13 ENCOUNTER — Other Ambulatory Visit: Payer: Self-pay | Admitting: Orthopaedic Surgery

## 2014-03-13 DIAGNOSIS — M79602 Pain in left arm: Secondary | ICD-10-CM

## 2014-03-13 DIAGNOSIS — K219 Gastro-esophageal reflux disease without esophagitis: Secondary | ICD-10-CM | POA: Insufficient documentation

## 2014-03-13 DIAGNOSIS — F319 Bipolar disorder, unspecified: Secondary | ICD-10-CM | POA: Insufficient documentation

## 2014-03-13 DIAGNOSIS — Z7901 Long term (current) use of anticoagulants: Secondary | ICD-10-CM | POA: Diagnosis not present

## 2014-03-13 DIAGNOSIS — I82402 Acute embolism and thrombosis of unspecified deep veins of left lower extremity: Secondary | ICD-10-CM

## 2014-03-13 DIAGNOSIS — Z720 Tobacco use: Secondary | ICD-10-CM | POA: Diagnosis not present

## 2014-03-13 DIAGNOSIS — F418 Other specified anxiety disorders: Secondary | ICD-10-CM | POA: Diagnosis not present

## 2014-03-13 DIAGNOSIS — M79605 Pain in left leg: Secondary | ICD-10-CM

## 2014-03-13 DIAGNOSIS — Z79899 Other long term (current) drug therapy: Secondary | ICD-10-CM | POA: Insufficient documentation

## 2014-03-13 DIAGNOSIS — M7989 Other specified soft tissue disorders: Principal | ICD-10-CM

## 2014-03-13 DIAGNOSIS — M79662 Pain in left lower leg: Secondary | ICD-10-CM | POA: Diagnosis present

## 2014-03-13 DIAGNOSIS — Z9889 Other specified postprocedural states: Secondary | ICD-10-CM | POA: Diagnosis not present

## 2014-03-13 MED ORDER — XARELTO VTE STARTER PACK 15 & 20 MG PO TBPK: 15.0000 mg | ORAL_TABLET | ORAL | Status: AC

## 2014-03-13 MED ORDER — OXYCODONE-ACETAMINOPHEN 5-325 MG PO TABS
2.0000 | ORAL_TABLET | Freq: Once | ORAL | Status: AC
  Administered 2014-03-13: 2 via ORAL
  Filled 2014-03-13: qty 2

## 2014-03-13 MED ORDER — OXYCODONE-ACETAMINOPHEN 5-325 MG PO TABS: 1.0000 | ORAL_TABLET | Freq: Four times a day (QID) | ORAL | Status: AC | PRN

## 2014-03-13 NOTE — ED Notes (Signed)
Pt reports having left knee replaced last Monday, having swelling to left foot, went for vascular study and positive dvt.

## 2014-03-13 NOTE — ED Provider Notes (Signed)
CSN: [id number]     Arrival date & time 03/13/14  1628 History   First MD Initiated Contact with Patient 03/13/14 1646     Chief Complaint  Patient presents with  . Leg Pain  . DVT     (Consider location/radiation/quality/duration/timing/severity/associated sxs/prior Treatment) Patient is a 58 y.o. female presenting with leg pain. The history is provided by the patient.  Leg Pain Associated symptoms: no fatigue and no fever    patient with DVT in the left lower leg. Had knee replacement 9 days ago by DR Turner Danielsowan. Had outpatient ultrasound today that showed DVT in the calf. No chest pain. No trouble breathing. States she's had increased pain in the lower leg the last few days. No lightheadedness or dizziness. No previous DVTs. She is a smoker.  Past Medical History  Diagnosis Date  . Complication of anesthesia   . PONV (postoperative nausea and vomiting)   . GERD (gastroesophageal reflux disease)   . History of hiatal hernia   . Diverticulosis   . Gastritis   . Depression   . Bipolar disorder   . Anxiety   . AVN (avascular necrosis of bone)     left knee   Past Surgical History  Procedure Laterality Date  . Hernia repair    . Rotator cuff repair      right  . Orif wrist fracture      has 15 screws and t-rod  . Fracture surgery      left  . Tubal ligation    . Total knee arthroplasty Left 03/04/2014    Procedure: LEFT TOTAL KNEE ARTHROPLASTY;  Surgeon: Nestor LewandowskyFrank J Rowan, MD;  Location: [facility] OR;  Service: Orthopedics;  Laterality: Left;   History reviewed. No pertinent family history. History  Substance Use Topics  . Smoking status: Current Every Day Smoker -- 1.00 packs/day for 37 years    Types: Cigarettes  . Smokeless tobacco: Never Used  . Alcohol Use: No   OB History    No data available     Review of Systems  Constitutional: Negative for fever and fatigue.  Respiratory: Negative for shortness of breath.   Cardiovascular: Positive for leg swelling. Negative for  chest pain.  Skin: Positive for color change and wound.  Hematological: Does not bruise/bleed easily.      Allergies  Penicillins and Codeine  Home Medications   Prior to Admission medications   Medication Sig Start Date End Date Taking? Authorizing Provider  albuterol (PROVENTIL HFA;VENTOLIN HFA) 108 (90 BASE) MCG/ACT inhaler Inhale 2 puffs into the lungs every 6 (six) hours as needed for wheezing.    Historical Provider, MD  atorvastatin (LIPITOR) 20 MG tablet Take 20 mg by mouth daily.    Historical Provider, MD  clonazePAM (KLONOPIN) 1 MG tablet Take 1 mg by mouth 3 (three) times daily.    Historical Provider, MD  CRANBERRY PO Take 1 tablet by mouth 2 (two) times daily.    Historical Provider, MD  escitalopram (LEXAPRO) 20 MG tablet Take 20 mg by mouth daily.    Historical Provider, MD  Guaifenesin (MUCINEX MAXIMUM STRENGTH) 1200 MG TB12 Take 1,200 mg by mouth daily.    Historical Provider, MD  omeprazole (PRILOSEC) 20 MG capsule Take 20 mg by mouth daily.    Historical Provider, MD  oxyCODONE-acetaminophen (ROXICET) 5-325 MG per tablet Take 1-2 tablets by mouth every 6 (six) hours as needed. 03/13/14   Juliet RudeNathan R. Rubin PayorPickering, MD  polyethylene glycol (MIRALAX / GLYCOLAX) packet Take 17  g by mouth daily.    Historical Provider, MD  Psyllium (METAMUCIL PO) Take 1 tablet by mouth daily.    Historical Provider, MD  tizanidine (ZANAFLEX) 2 MG capsule Take 1 capsule (2 mg total) by mouth 3 (three) times daily. 03/04/14   Allena KatzEric K Phillips, PA-C  topiramate (TOPAMAX) 100 MG tablet Take 100-200 mg by mouth 2 (two) times daily. Take 100 mg every morning and 200 mg every evening    Historical Provider, MD  XARELTO STARTER PACK 15 & 20 MG TBPK Take 15-20 mg by mouth as directed. Take as directed on package: Start with one 15mg  tablet by mouth twice a day with food. On Day 22, switch to one 20mg  tablet once a day with food. 03/13/14   Juliet RudeNathan R. Lillyrose Reitan, MD   BP 122/67 mmHg  Pulse 75  Temp(Src) 98.2  F (36.8 C) (Oral)  Resp 18  SpO2 97% Physical Exam  Constitutional: She appears well-developed and well-nourished.  Cardiovascular: Normal rate and regular rhythm.   Pulmonary/Chest: Effort normal.  Abdominal: Soft. There is no tenderness.  Musculoskeletal: She exhibits edema.  Dressing over left knee from surgery. Some erythema and bruising around the knee. Moderate edema on the left lower leg is much larger than the contralateral side. There is some ecchymosis in the lateral left foot.  Skin: Skin is warm.    ED Course  Procedures (including critical care time) Labs Review Labs Reviewed - No data to display  Imaging Review No results found.   EKG Interpretation None      MDM   Final diagnoses:  DVT (deep venous thrombosis), left    Patient with a DVT of left lower leg after knee replacement. Discussed with Dr. Jerl Santosalldorf and will start Xarelto. Doubt pulmonary embolism. Is on aspirin and will hold for now. Will increase pain medicine due to increased pain. Will discharge home and has follow-up next week with her surgeon.Juliet Rude,    Odelia Graciano R. Rubin PayorPickering, MD 03/13/14 1745

## 2014-03-13 NOTE — Discharge Instructions (Signed)

## 2014-03-13 NOTE — Progress Notes (Signed)
VASCULAR LAB PRELIMINARY  PRELIMINARY  PRELIMINARY  PRELIMINARY  Bilateral lower extremity venous duplex  completed.    Preliminary report:  Right:  No evidence of DVT, superficial thrombosis, or Drury's cyst.  Left: DVT noted in the PTV and peroneal veins throughout the calf.  No evidence of superficial thrombosis.  No Borgwardt's cyst.   Laguana Desautel, RVT 03/13/2014, 4:05 PM

## 2014-05-15 ENCOUNTER — Encounter: Payer: Self-pay | Admitting: Neurology

## 2014-05-15 ENCOUNTER — Ambulatory Visit (INDEPENDENT_AMBULATORY_CARE_PROVIDER_SITE_OTHER): Payer: BLUE CROSS/BLUE SHIELD | Admitting: Neurology

## 2014-05-15 VITALS — BP 108/63 | HR 82 | Ht 62.0 in | Wt 111.4 lb

## 2014-05-15 DIAGNOSIS — R2689 Other abnormalities of gait and mobility: Secondary | ICD-10-CM

## 2014-05-15 NOTE — Progress Notes (Signed)
[facility]    Provider:  Dr Lucia Gaskins Referring Provider: Abelina Bachelor, MD Primary Care Physician:  Abelina Bachelor, MD  CC:  Abnormal movements   HPI:  Samantha Michael is a 59 y.o. female here as a referral from Dr. August Saucer for knee pain and abnormal movements after left knee surgery. She brings in a video which shows her walking and her left bends at the knee with internal rotation of thigh and flails out briefly then returns to normal. Patient bends at the hips when it happens. Muscles/leg not stiff or painful when it happens. There is no pain, it just catches her off guard. No change in awareness during the event. Usually happens when walking but has happened when she is sitting still as well.  It happens almost every time she walks. It is making her knee hurt from the rotation. She refuses to walk without the walker because she is scared she will fall. This unusual movement started right after surgery. She has been following with orthopaedics and they have tested her knee and it is fine.  Doesn't know if she has abnormal movements during sleep. Didn't happen before the surgery, is always always the same movement, "up and out". Surgery was nov 9th. She is in physical therapy at home. No back pain, no paresthesias, no muscle pain or dystonia. She feels it is worsening and interfering with her ability to completely recover. She continues PT but is scared to use a cane. There is no FHx of neuromuscular disorders.   Review of medical records significant for the following: CBC with anemia (hgb 10.2), LFTs wnl, BMP unremarkable. 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 Recent Xrays showed well-placed, well fixed depuy sigma RP left total knee arhthroplasty without evidence of loosening. She is on Xarelto due to DVT (left posterior tibial dvt) found in the leg after pain and swelling on 03-14-2014.  She has COPD and smokes.   Review of Systems: Patient complains of symptoms per  HPI as well as the following symptoms: restless legs, depression, anxiety, not enough sleep. Pertinent negatives per HPI. All others negative.  History   Social History  . Marital Status: Married    Spouse Name: [name]    Number of Children: 4  . Years of Education: 12   Occupational History  . Unemployed     Social History Main Topics  . Smoking status: Current Every Day Smoker -- 1.00 packs/day for 37 years    Types: Cigarettes  . Smokeless tobacco: Never Used  . Alcohol Use: No  . Drug Use: No  . Sexual Activity: Not on file   Other Topics Concern  . Not on file   Social History Narrative   Patient lives at home with husband [name]   Patient has 4 children    Patient has a high school education    Patient does not work    Patient is right handed    History reviewed. No pertinent family history.  Past Medical History  Diagnosis Date  . Complication of anesthesia   . PONV (postoperative nausea and vomiting)   . GERD (gastroesophageal reflux disease)   . History of hiatal hernia   . Diverticulosis   . Gastritis   . Depression   . Bipolar disorder   . Anxiety   . AVN (avascular necrosis of bone)     left knee    Past Surgical History  Procedure Laterality Date  . Hernia repair    .  Rotator cuff repair      right  . Orif wrist fracture      has 15 screws and t-rod  . Fracture surgery      left  . Tubal ligation    . Total knee arthroplasty Left 03/04/2014    Procedure: LEFT TOTAL KNEE ARTHROPLASTY;  Surgeon: Nestor Lewandowsky, MD;  Location: [facility] OR;  Service: Orthopedics;  Laterality: Left;    Current Outpatient Prescriptions  Medication Sig Dispense Refill  . albuterol (PROVENTIL HFA;VENTOLIN HFA) 108 (90 BASE) MCG/ACT inhaler Inhale 2 puffs into the lungs every 6 (six) hours as needed for wheezing.    Marland Kitchen atorvastatin (LIPITOR) 20 MG tablet Take 20 mg by mouth daily.    . citalopram (CELEXA) 20 MG tablet Take 20 mg by mouth.    . clonazePAM (KLONOPIN) 1 MG  tablet Take 1 mg by mouth 3 (three) times daily.    Marland Kitchen CRANBERRY PO Take 1 tablet by mouth 2 (two) times daily.    . Guaifenesin (MUCINEX MAXIMUM STRENGTH) 1200 MG TB12 Take 1,200 mg by mouth daily.    Marland Kitchen omeprazole (PRILOSEC) 20 MG capsule Take 20 mg by mouth daily.    Marland Kitchen oxyCODONE-acetaminophen (ROXICET) 5-325 MG per tablet Take 1-2 tablets by mouth every 6 (six) hours as needed. 30 tablet 0  . polyethylene glycol (MIRALAX / GLYCOLAX) packet Take 17 g by mouth daily.    . Psyllium (METAMUCIL PO) Take 1 tablet by mouth daily.    Marland Kitchen topiramate (TOPAMAX) 100 MG tablet Take 100-200 mg by mouth 2 (two) times daily. Take 100 mg every morning and 200 mg every evening    . XARELTO STARTER PACK 15 & 20 MG TBPK Take 15-20 mg by mouth as directed. Take as directed on package: Start with one  tablet by mouth twice a day with food. On Day 22, switch to one  tablet once a day with food. 51 each 0   No current facility-administered medications for this visit.    Allergies as of 05/15/2014 - Review Complete 05/15/2014  Allergen Reaction Noted  . Penicillins Anaphylaxis and Swelling 02/18/2014  . Antihistamines, chlorpheniramine-type  05/15/2014  . Codeine Nausea Only 03/04/2014  . Hydrocodone-acetaminophen Nausea And Vomiting 05/15/2014  . Meloxicam  05/15/2014  . Meperidine  05/15/2014    Vitals: BP 108/63 mmHg  Pulse 82  Ht  (1.575 m)  Wt 111 lb 6.4 oz (50.531 kg)  BMI 20.37 kg/m2 Last Weight:  Wt Readings from Last 1 Encounters:  05/15/14 111 lb 6.4 oz (50.531 kg)   Last Height:   Ht Readings from Last 1 Encounters:  05/15/14  (1.575 m)   Physical exam: Exam: Gen: NAD, conversant, well nourised                   CV: RRR, no MRG. No Carotid Bruits.  Eyes: Conjunctivae clear without exudates or hemorrhage  Neuro: Detailed Neurologic Exam  Speech:    Speech is normal; fluent and spontaneous with normal comprehension.  Cognition:    The patient is oriented to person,  place, and time;     recent and remote memory intact;     language fluent;     normal attention, concentration,     fund of knowledge Cranial Nerves:    The pupils are equal, round, and reactive to light. The fundi are normal and spontaneous venous pulsations are present. Visual fields are full to finger confrontation. Extraocular movements are intact. Trigeminal sensation is  intact and the muscles of mastication are normal. The face is symmetric. The palate elevates in the midline. Hearing intact. Voice is normal. Shoulder shrug is normal. The tongue has normal motion without fasciculations.   Coordination:    Normal finger to nose and heel to shin. Normal rapid alternating movements.   Gait:    Uses walker. The abnormal movement occurs once with ambulation. Her left leg bends at the knee with internal rotation of thigh and flails out briefly then returns to normal. Patient bends at the hips when it happens. There is no increased tone in the leg and doesn't appear to be painful, patient can continue walking right afterwards, no change in consciousness.   Motor Observation:    No asymmetry, no atrophy, and no involuntary movements noted. Tone:    Normal muscle tone.    Posture:    Posture is normal    Strength:Left mild hip flexion weakness otherwise strength is V/V in the upper and lower limbs.      Sensation: intact to LT     Reflex Exam:  DTR's: Deferred left knee reflex due to recent surgery otherwise deep tendon reflexes in the upper and lower extremities are normal bilaterally.   Toes:    The toes are downgoing bilaterally.   Clonus:    Clonus is absent.   Assessment/Plan:  59 yr old female here for evaluation of gait abnormality after left knee surgery. Patient brings in a video of her gait and it is also observed during exam with ambulation. Her left bends at the knee with internal rotation of thigh and flails out briefly then returns to normal. Patient bends at the hips  when it happens. here is no increased tone in the leg and doesn't appear to be painful, patient can continue walking right afterwards, no change in consciousness.  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 Recommended continued physical therapy and reassurance. Neuro exam unremarkable. Highly encourages smoking cessation.     Naomie DeanAntonia Ahern, MD  [facility] [address] [place], [place] [id number]  Phone [phone] Fax [phone]

## 2014-05-19 DIAGNOSIS — R2689 Other abnormalities of gait and mobility: Secondary | ICD-10-CM | POA: Insufficient documentation

## 2015-10-13 ENCOUNTER — Other Ambulatory Visit (HOSPITAL_COMMUNITY): Payer: Self-pay | Admitting: Psychiatry

## 2019-06-05 ENCOUNTER — Telehealth: Payer: Self-pay

## 2019-06-05 NOTE — Telephone Encounter (Signed)
Error
